# Patient Record
Sex: Female | Born: 1980 | Race: Black or African American | Hispanic: No | Marital: Single | Smoking: Current every day smoker
Health system: Southern US, Community
[De-identification: ages and names within clinical notes are randomized; demographics above are authoritative.]

## PROBLEM LIST (undated history)

## (undated) DIAGNOSIS — R7303 Prediabetes: Secondary | ICD-10-CM

## (undated) DIAGNOSIS — E119 Type 2 diabetes mellitus without complications: Secondary | ICD-10-CM

## (undated) DIAGNOSIS — D509 Iron deficiency anemia, unspecified: Secondary | ICD-10-CM

## (undated) DIAGNOSIS — I10 Essential (primary) hypertension: Secondary | ICD-10-CM

## (undated) HISTORY — DX: Iron deficiency anemia, unspecified: D50.9

## (undated) HISTORY — DX: Prediabetes: R73.03

---

## 2004-06-21 ENCOUNTER — Emergency Department: Payer: Self-pay | Admitting: Emergency Medicine

## 2009-01-20 ENCOUNTER — Emergency Department: Payer: Self-pay | Admitting: Emergency Medicine

## 2009-01-22 ENCOUNTER — Encounter: Payer: Self-pay | Admitting: Maternal and Fetal Medicine

## 2009-02-07 ENCOUNTER — Emergency Department: Payer: Self-pay | Admitting: Emergency Medicine

## 2010-08-08 ENCOUNTER — Emergency Department: Payer: Self-pay | Admitting: Emergency Medicine

## 2010-08-08 DIAGNOSIS — F17200 Nicotine dependence, unspecified, uncomplicated: Secondary | ICD-10-CM | POA: Insufficient documentation

## 2010-08-08 DIAGNOSIS — R519 Headache, unspecified: Secondary | ICD-10-CM | POA: Insufficient documentation

## 2010-08-08 DIAGNOSIS — R112 Nausea with vomiting, unspecified: Secondary | ICD-10-CM | POA: Insufficient documentation

## 2010-08-08 DIAGNOSIS — R51 Headache: Secondary | ICD-10-CM | POA: Insufficient documentation

## 2010-08-23 ENCOUNTER — Emergency Department: Payer: Self-pay | Admitting: Unknown Physician Specialty

## 2010-09-17 ENCOUNTER — Emergency Department: Payer: Self-pay | Admitting: Emergency Medicine

## 2010-09-19 DIAGNOSIS — G35 Multiple sclerosis: Secondary | ICD-10-CM | POA: Insufficient documentation

## 2013-03-18 IMAGING — CT CT HEAD WITHOUT CONTRAST
2 series · 15 of 30 positions shown, 19 images · non-contrast
Comparison: none

REASON FOR EXAM: vertigo w/ vertical nyustagmus
COMMENTS:

PROCEDURE:     CT  - CT HEAD WITHOUT CONTRAST  - August 08, 2010  [DATE]
RESULT:     Comparison:  None
TECHNIQUE: Multiple axial images from the foramen magnum to the vertex were
obtained without IV contrast.

[Series 2: without · axial · non-contrast · 0.42mm/px · z∈[-96,+29]mm · 13 of 31 slices shown, 17 images]
[im 3/31  brain]
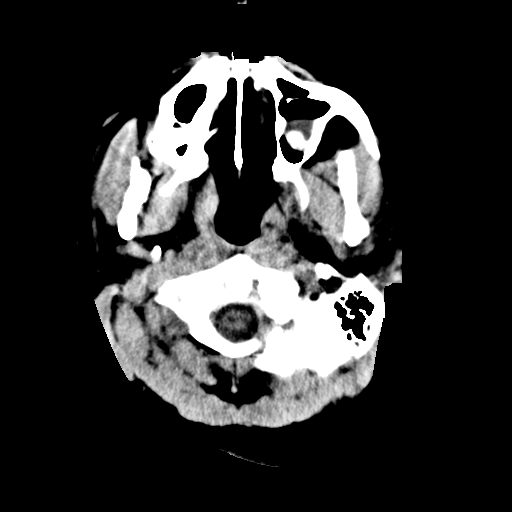
[im 3/31  bone]
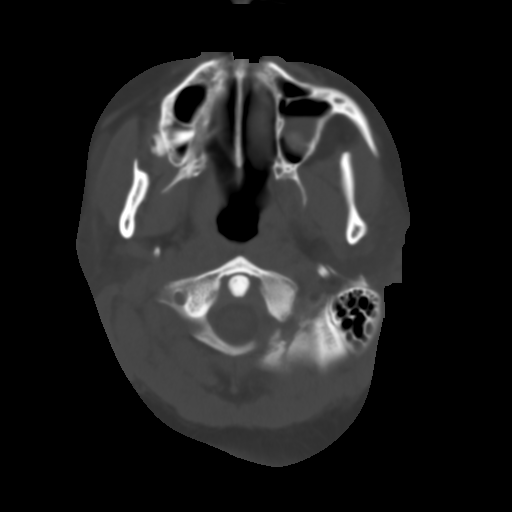
[im 5/31  brain]
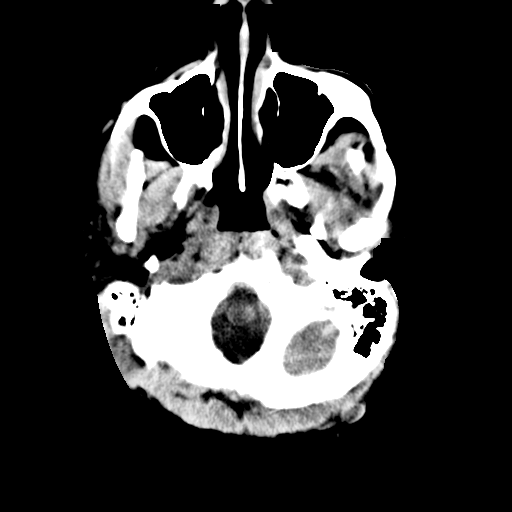
[im 7/31  brain]
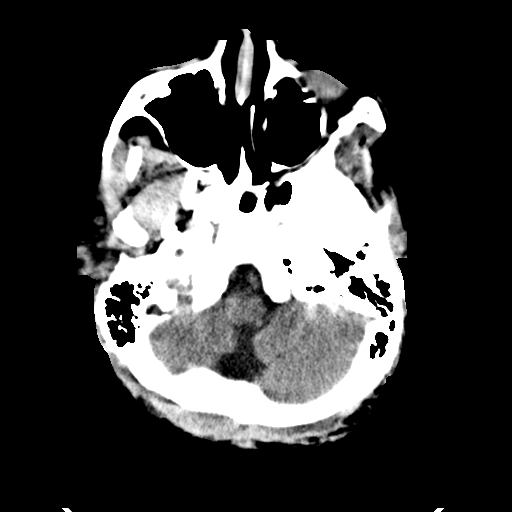
[im 9/31  brain]
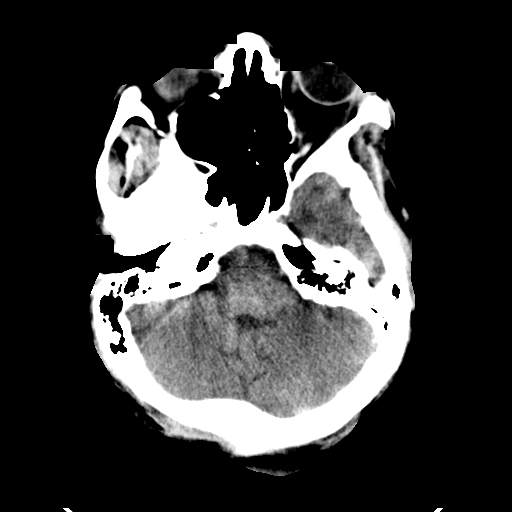
[im 11/31  brain]
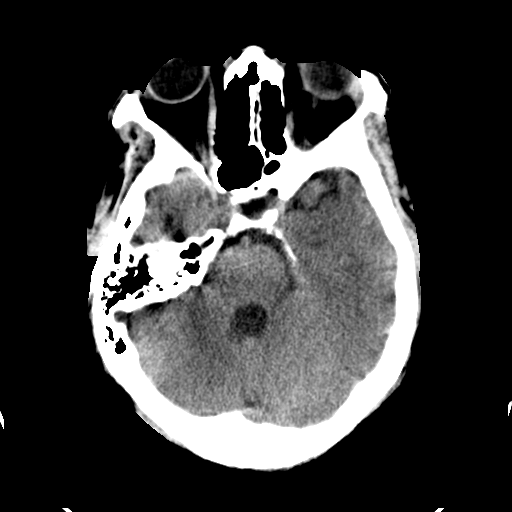
[im 11/31  bone]
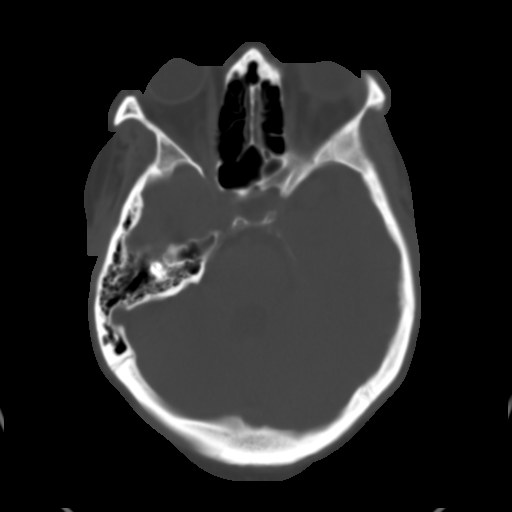
[im 13/31  brain]
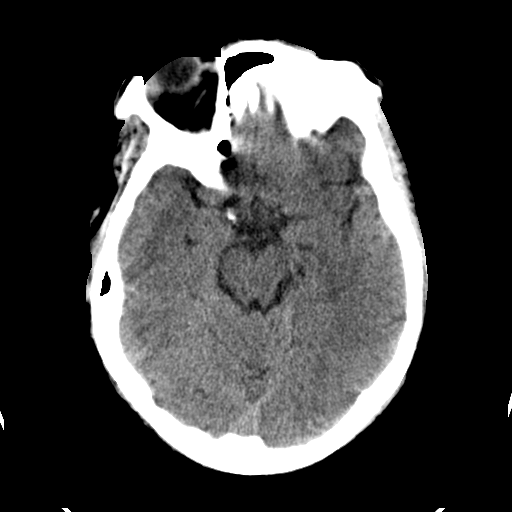
[im 16/31  brain]
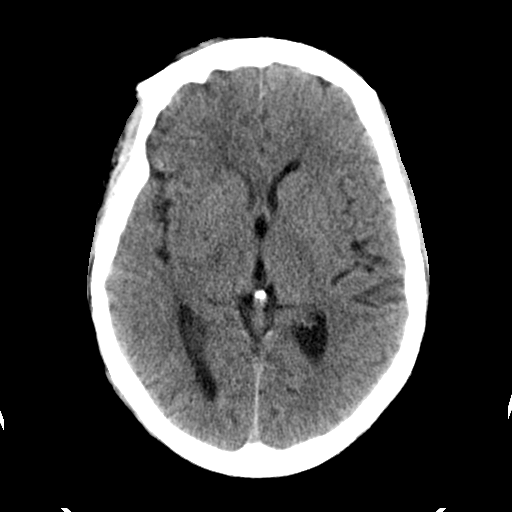
[im 18/31  brain]
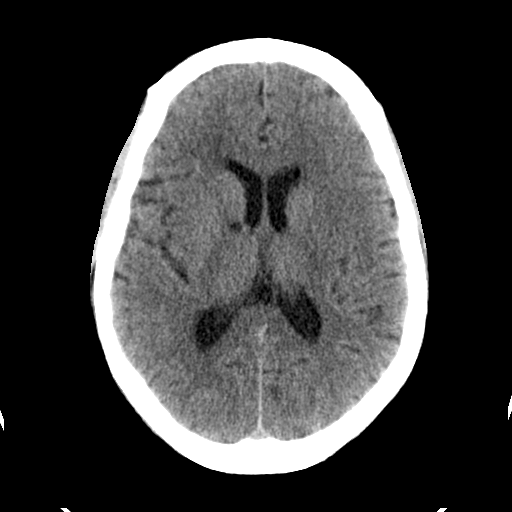
[im 20/31  brain]
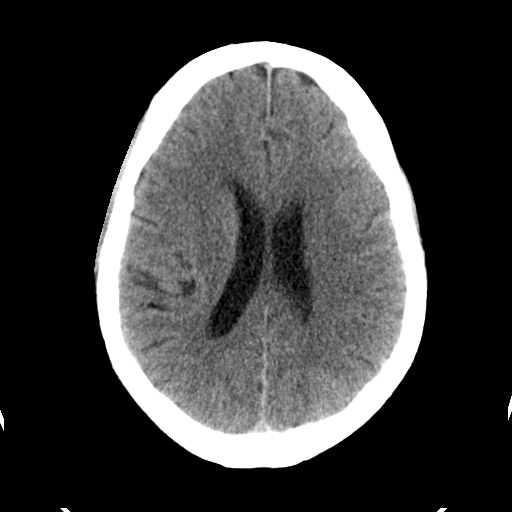
[im 20/31  bone]
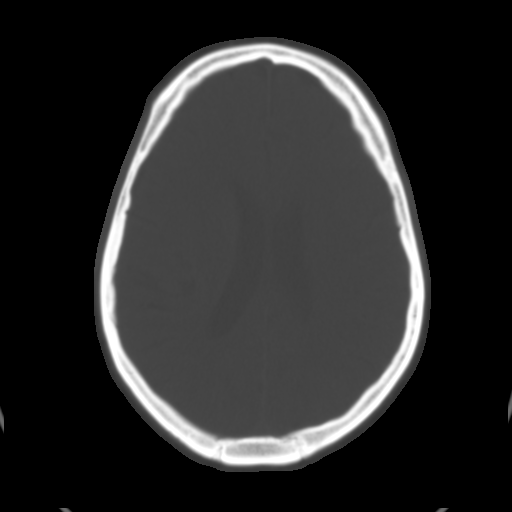
[im 22/31  brain]
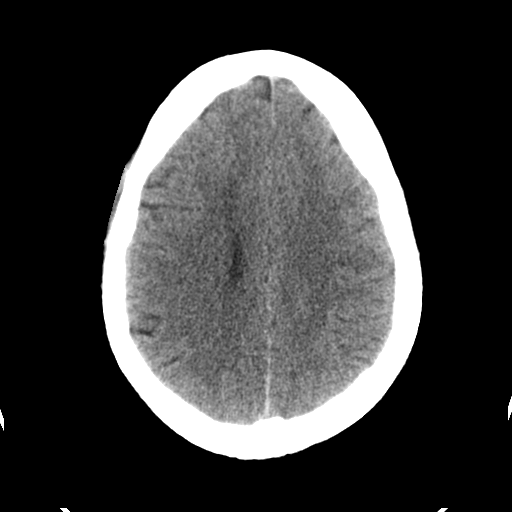
[im 24/31  brain]
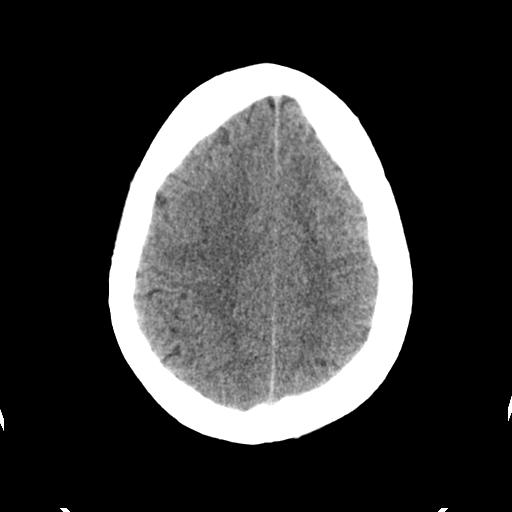
[im 26/31  brain]
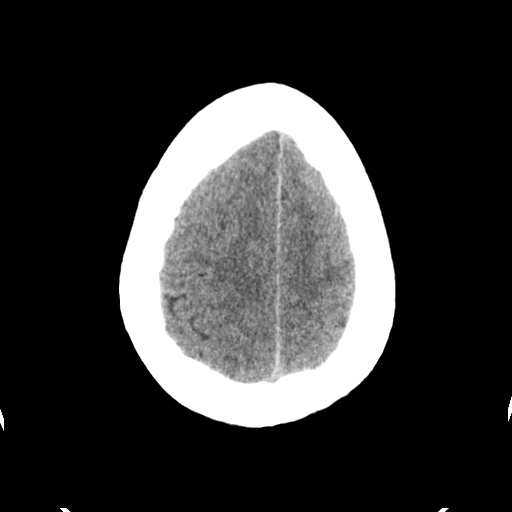
[im 28/31  brain]
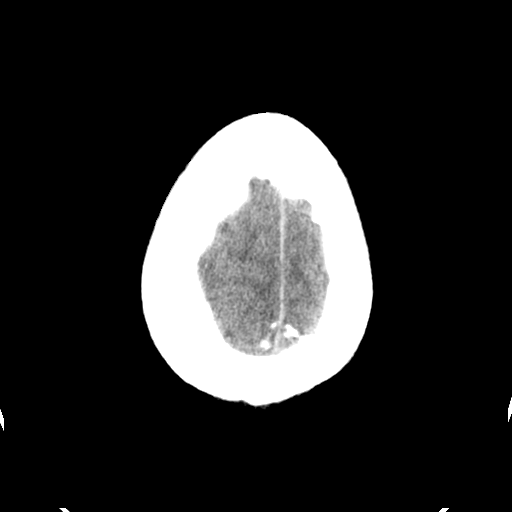
[im 28/31  bone]
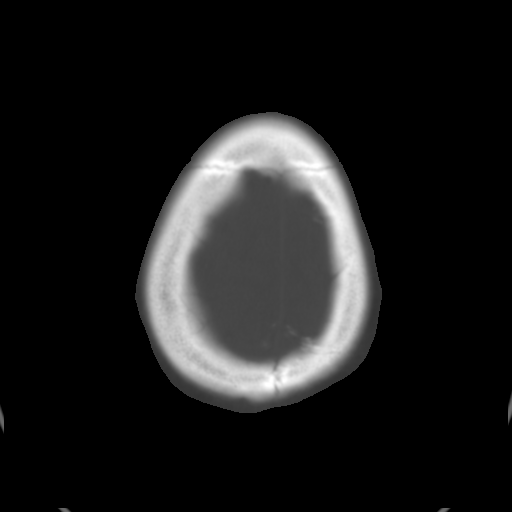

[Series 3: bone · axial · 0.42mm/px · z∈[-96,-76]mm · 2 of 31 slices shown]
[im 3/31  bone]
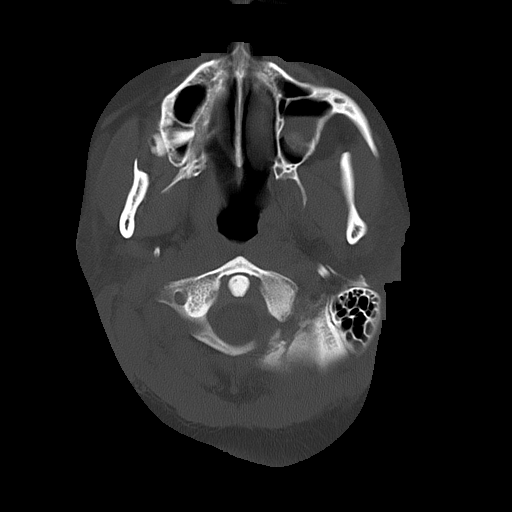
[im 7/31  bone]
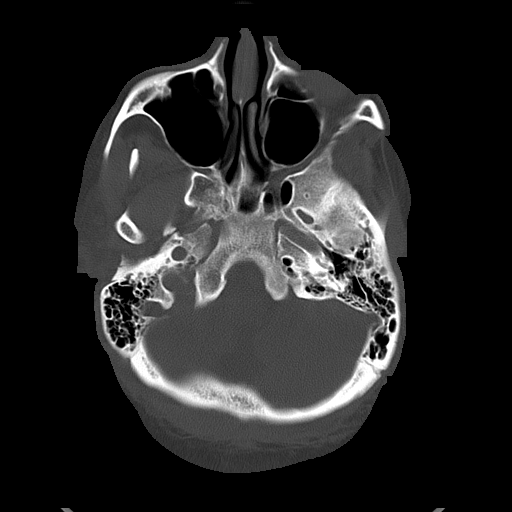

[15 of 30 positions shown; findings below may reference images not displayed]

FINDINGS: There is a small, 4 mm focus of low-attenuation near the posterior aspect of
the right caudate head, near the medial aspect of the right anterior limb of
the internal capsule. This is indeterminate, but may represent an
age-indeterminate lacunar type infarct. No acute intracranial hemorrhage. No
midline shift. The ventricles are normal in size. No extra-axial fluid
collection. No evidence of cortical-based infarction.

Small mucus retention cyst is seen in the left maxillary sinus.
IMPRESSION: 1. No acute intracranial hemorrhage.
2. Findings which may represent a small age indeterminate lacunar infarct in
the region of the right caudate head and the right anterior limb of the
internal capsule. Further evaluation with contrast-enhanced MRI is
recommended.

## 2017-02-06 ENCOUNTER — Other Ambulatory Visit: Payer: Self-pay

## 2017-02-06 ENCOUNTER — Encounter: Payer: Self-pay | Admitting: Emergency Medicine

## 2017-02-06 ENCOUNTER — Emergency Department: Payer: No Typology Code available for payment source

## 2017-02-06 DIAGNOSIS — I10 Essential (primary) hypertension: Secondary | ICD-10-CM | POA: Diagnosis not present

## 2017-02-06 DIAGNOSIS — M7918 Myalgia, other site: Secondary | ICD-10-CM | POA: Insufficient documentation

## 2017-02-06 DIAGNOSIS — F17200 Nicotine dependence, unspecified, uncomplicated: Secondary | ICD-10-CM | POA: Insufficient documentation

## 2017-02-06 DIAGNOSIS — M25512 Pain in left shoulder: Secondary | ICD-10-CM | POA: Diagnosis not present
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
## 2017-02-07 ENCOUNTER — Emergency Department
Admission: EM | Admit: 2017-02-07 | Discharge: 2017-02-07 | Disposition: A | Discharge status: Home / Self Care | Payer: No Typology Code available for payment source | Attending: Emergency Medicine | Admitting: Emergency Medicine

## 2017-02-07 DIAGNOSIS — M7918 Myalgia, other site: Secondary | ICD-10-CM

## 2017-02-07 DIAGNOSIS — I10 Essential (primary) hypertension: Secondary | ICD-10-CM

## 2017-02-07 DIAGNOSIS — M25512 Pain in left shoulder: Secondary | ICD-10-CM

## 2017-02-07 HISTORY — DX: Essential (primary) hypertension: I10

## 2017-02-07 MED ORDER — LIDOCAINE 5 % EX PTCH
1.0000 | MEDICATED_PATCH | CUTANEOUS | Status: DC
  Administered 2017-02-07: 1 via TRANSDERMAL
  Filled 2017-02-07: qty 1

## 2017-02-07 MED ORDER — HYDROCHLOROTHIAZIDE 25 MG PO TABS: 25.0000 mg | ORAL_TABLET | Freq: Every day | ORAL | 0 refills | Status: DC

## 2017-02-07 MED ORDER — KETOROLAC TROMETHAMINE 60 MG/2ML IM SOLN
60.0000 mg | Freq: Once | INTRAMUSCULAR | Status: DC
  Filled 2017-02-07: qty 2

## 2017-02-07 MED ORDER — TRAMADOL HCL 50 MG PO TABS: 50.0000 mg | ORAL_TABLET | Freq: Four times a day (QID) | ORAL | 0 refills | Status: DC | PRN

## 2017-02-07 NOTE — Discharge Instructions (Signed)
Please continue taking pain medications and anti inflammatory medications. Please follow up with [facility] for further evaluation of your blood pressure
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
## 2017-02-07 NOTE — ED Notes (Signed)
Reviewed d/c instructions, follow-up care, prescription with patient. Patient verbalized understanding 

## 2017-03-16 DIAGNOSIS — K5900 Constipation, unspecified: Secondary | ICD-10-CM | POA: Diagnosis not present

## 2017-03-16 DIAGNOSIS — I10 Essential (primary) hypertension: Secondary | ICD-10-CM | POA: Diagnosis not present

## 2017-05-04 DIAGNOSIS — D649 Anemia, unspecified: Secondary | ICD-10-CM | POA: Diagnosis not present

## 2017-05-04 DIAGNOSIS — R7309 Other abnormal glucose: Secondary | ICD-10-CM | POA: Diagnosis not present

## 2017-05-04 DIAGNOSIS — I10 Essential (primary) hypertension: Secondary | ICD-10-CM | POA: Diagnosis not present

## 2017-05-11 ENCOUNTER — Ambulatory Visit (INDEPENDENT_AMBULATORY_CARE_PROVIDER_SITE_OTHER): Payer: 59 | Admitting: Family Medicine

## 2017-05-11 ENCOUNTER — Other Ambulatory Visit: Payer: Self-pay

## 2017-05-11 ENCOUNTER — Encounter: Payer: Self-pay | Admitting: Family Medicine

## 2017-05-11 ENCOUNTER — Ambulatory Visit: Payer: Self-pay | Admitting: Family Medicine

## 2017-05-11 VITALS — BP 140/94 | HR 98 | Temp 97.9°F | Ht 64.57 in | Wt 152.8 lb

## 2017-05-11 DIAGNOSIS — T7491XA Unspecified adult maltreatment, confirmed, initial encounter: Secondary | ICD-10-CM | POA: Diagnosis not present

## 2017-05-11 DIAGNOSIS — D500 Iron deficiency anemia secondary to blood loss (chronic): Secondary | ICD-10-CM | POA: Insufficient documentation

## 2017-05-11 DIAGNOSIS — M79675 Pain in left toe(s): Secondary | ICD-10-CM | POA: Diagnosis not present

## 2017-05-11 DIAGNOSIS — B351 Tinea unguium: Secondary | ICD-10-CM | POA: Diagnosis not present

## 2017-05-11 DIAGNOSIS — R7303 Prediabetes: Secondary | ICD-10-CM | POA: Diagnosis not present

## 2017-05-11 DIAGNOSIS — Z113 Encounter for screening for infections with a predominantly sexual mode of transmission: Secondary | ICD-10-CM | POA: Diagnosis not present

## 2017-05-11 DIAGNOSIS — H53422 Scotoma of blind spot area, left eye: Secondary | ICD-10-CM | POA: Diagnosis not present

## 2017-05-11 DIAGNOSIS — Z124 Encounter for screening for malignant neoplasm of cervix: Secondary | ICD-10-CM

## 2017-05-11 DIAGNOSIS — I10 Essential (primary) hypertension: Secondary | ICD-10-CM | POA: Diagnosis not present

## 2017-05-11 DIAGNOSIS — F322 Major depressive disorder, single episode, severe without psychotic features: Secondary | ICD-10-CM | POA: Diagnosis not present

## 2017-05-11 DIAGNOSIS — N920 Excessive and frequent menstruation with regular cycle: Secondary | ICD-10-CM | POA: Insufficient documentation

## 2017-05-11 DIAGNOSIS — M79674 Pain in right toe(s): Secondary | ICD-10-CM | POA: Diagnosis not present

## 2017-05-11 MED ORDER — SERTRALINE HCL 25 MG PO TABS: 25.0000 mg | ORAL_TABLET | Freq: Every day | ORAL | 2 refills | Status: DC

## 2017-05-11 MED ORDER — HYDROCHLOROTHIAZIDE 25 MG PO TABS: 25.0000 mg | ORAL_TABLET | Freq: Every day | ORAL | 5 refills | Status: DC

## 2017-05-11 MED ORDER — LISINOPRIL 10 MG PO TABS: 10.0000 mg | ORAL_TABLET | Freq: Every day | ORAL | 5 refills | Status: DC
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
## 2017-05-11 NOTE — Patient Instructions (Signed)
     IF you received an x-ray today, you will receive an invoice from [facility]. Please contact [facility] at [phone] with questions or concerns regarding your invoice.   IF you received labwork today, you will receive an invoice from [facility]. Please contact [facility] at [phone] with questions or concerns regarding your invoice.   Our billing staff will not be able to assist you with questions regarding bills from these companies.  You will be contacted with the lab results as soon as they are available. The fastest way to get your results is to activate your My Chart account. Instructions are located on the last page of this paperwork. If you have not heard from us regarding the results in 2 weeks, please contact this office.     

## 2017-05-12 LAB — BASIC METABOLIC PANEL
BUN/Creatinine Ratio: 12 (ref 9–23)
BUN: 9 mg/dL (ref 6–20)
CO2: 21 mmol/L (ref 20–29)
Calcium: 9.8 mg/dL (ref 8.7–10.2)
Chloride: 98 mmol/L (ref 96–106)
Creatinine, Ser: 0.75 mg/dL (ref 0.57–1.00)
GFR calc Af Amer: 120 mL/min/{1.73_m2} (ref >59)
GFR calc non Af Amer: 104 mL/min/{1.73_m2} (ref >59)
Glucose: 145 mg/dL — ABNORMAL HIGH (ref 65–99)
Potassium: 4.3 mmol/L (ref 3.5–5.2)
Sodium: 137 mmol/L (ref 134–144)

## 2017-05-12 LAB — CBC
Hematocrit: 31.7 % — ABNORMAL LOW (ref 34.0–46.6)
Hemoglobin: 9.2 g/dL — ABNORMAL LOW (ref 11.1–15.9)
MCH: 20.9 pg — ABNORMAL LOW (ref 26.6–33.0)
MCHC: 29 g/dL — ABNORMAL LOW (ref 31.5–35.7)
MCV: 72 fL — ABNORMAL LOW (ref 79–97)
Platelets: 495 10*3/uL — ABNORMAL HIGH (ref 150–379)
RBC: 4.41 x10E6/uL (ref 3.77–5.28)
RDW: 21.8 % — ABNORMAL HIGH (ref 12.3–15.4)
WBC: 9.1 10*3/uL (ref 3.4–10.8)

## 2017-05-12 LAB — FERRITIN: Ferritin: 26 ng/mL (ref 15–150)

## 2017-05-12 LAB — HIV ANTIBODY (ROUTINE TESTING W REFLEX): HIV Screen 4th Generation wRfx: NONREACTIVE

## 2017-05-12 LAB — RPR: RPR Ser Ql: NONREACTIVE

## 2017-05-13 LAB — GC/CHLAMYDIA PROBE AMP
Chlamydia trachomatis, NAA: NEGATIVE
Neisseria gonorrhoeae by PCR: NEGATIVE

## 2017-05-16 LAB — PAP IG W/ RFLX HPV ASCU: PAP Smear Comment: 0

## 2017-05-16 MED ORDER — FERROUS GLUCONATE 324 (38 FE) MG PO TABS: 324.0000 mg | ORAL_TABLET | Freq: Every day | ORAL | 3 refills | Status: DC

## 2017-05-21 DIAGNOSIS — R7309 Other abnormal glucose: Secondary | ICD-10-CM | POA: Insufficient documentation

## 2017-05-21 DIAGNOSIS — D649 Anemia, unspecified: Secondary | ICD-10-CM | POA: Insufficient documentation

## 2017-05-22 ENCOUNTER — Telehealth: Payer: Self-pay | Admitting: Family Medicine

## 2017-05-22 NOTE — Telephone Encounter (Signed)
Pt has referral in for ophthalmology. Can we get OV notes from 2/14 signed so we can send referral? Thank you!

## 2017-05-22 NOTE — Telephone Encounter (Signed)
So sorry about delay, note completed. thanks

## 2017-05-23 NOTE — Telephone Encounter (Signed)
Referral sent to [facility] on 2/26. Thanks!

## 2017-05-23 NOTE — Telephone Encounter (Signed)
noted 

## 2017-05-24 DIAGNOSIS — H3343 Traction detachment of retina, bilateral: Secondary | ICD-10-CM | POA: Diagnosis not present

## 2017-05-24 DIAGNOSIS — H3500 Unspecified background retinopathy: Secondary | ICD-10-CM | POA: Diagnosis not present

## 2017-05-24 DIAGNOSIS — H3563 Retinal hemorrhage, bilateral: Secondary | ICD-10-CM | POA: Diagnosis not present

## 2017-05-24 LAB — HM DIABETES EYE EXAM

## 2017-05-26 ENCOUNTER — Ambulatory Visit (INDEPENDENT_AMBULATORY_CARE_PROVIDER_SITE_OTHER): Payer: 59 | Admitting: Family Medicine

## 2017-05-26 ENCOUNTER — Encounter: Payer: Self-pay | Admitting: Family Medicine

## 2017-05-26 VITALS — BP 132/84 | HR 84 | Temp 97.9°F | Resp 16 | Ht 64.0 in | Wt 156.0 lb

## 2017-05-26 DIAGNOSIS — F322 Major depressive disorder, single episode, severe without psychotic features: Secondary | ICD-10-CM

## 2017-05-26 DIAGNOSIS — D500 Iron deficiency anemia secondary to blood loss (chronic): Secondary | ICD-10-CM | POA: Diagnosis not present

## 2017-05-26 DIAGNOSIS — T7491XD Unspecified adult maltreatment, confirmed, subsequent encounter: Secondary | ICD-10-CM

## 2017-05-26 DIAGNOSIS — I10 Essential (primary) hypertension: Secondary | ICD-10-CM

## 2017-05-26 MED ORDER — SERTRALINE HCL 50 MG PO TABS: 50.0000 mg | ORAL_TABLET | Freq: Every day | ORAL | 2 refills | Status: DC

## 2017-05-26 NOTE — Patient Instructions (Addendum)
1. Increasing sertraline to 50mg  once a day. If after 2 weeks you are tolerating this dose well but depression not significantly improved, increase to 100mg  (2 tabs) once a day. Please let me know if you do this so that I can send in a new prescription.     IF you received an x-ray today, you will receive an invoice from [facility]. Please contact [facility] at [phone] with questions or concerns regarding your invoice.   IF you received labwork today, you will receive an invoice from [facility]. Please contact [facility] at [phone] with questions or concerns regarding your invoice.   Our billing staff will not be able to assist you with questions regarding bills from these companies.  You will be contacted with the lab results as soon as they are available. The fastest way to get your results is to activate your My Chart account. Instructions are located on the last page of this paperwork. If you have not heard from us regarding the results in 2 weeks, please contact this office.
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
## 2017-07-07 ENCOUNTER — Ambulatory Visit: Payer: 59 | Admitting: Family Medicine

## 2019-05-17 ENCOUNTER — Inpatient Hospital Stay
Admission: EM | Admit: 2019-05-17 | Discharge: 2019-05-19 | DRG: 065 | Disposition: A | Discharge status: Home / Self Care | Payer: Medicaid Other | Attending: Internal Medicine | Admitting: Internal Medicine

## 2019-05-17 ENCOUNTER — Encounter: Payer: Self-pay | Admitting: Intensive Care

## 2019-05-17 ENCOUNTER — Other Ambulatory Visit: Payer: Self-pay

## 2019-05-17 ENCOUNTER — Emergency Department: Payer: Medicaid Other

## 2019-05-17 DIAGNOSIS — D500 Iron deficiency anemia secondary to blood loss (chronic): Secondary | ICD-10-CM | POA: Diagnosis present

## 2019-05-17 DIAGNOSIS — E785 Hyperlipidemia, unspecified: Secondary | ICD-10-CM | POA: Diagnosis present

## 2019-05-17 DIAGNOSIS — I639 Cerebral infarction, unspecified: Principal | ICD-10-CM | POA: Diagnosis present

## 2019-05-17 DIAGNOSIS — Z823 Family history of stroke: Secondary | ICD-10-CM

## 2019-05-17 DIAGNOSIS — I10 Essential (primary) hypertension: Secondary | ICD-10-CM | POA: Diagnosis present

## 2019-05-17 DIAGNOSIS — Z9119 Patient's noncompliance with other medical treatment and regimen: Secondary | ICD-10-CM

## 2019-05-17 DIAGNOSIS — Z20822 Contact with and (suspected) exposure to covid-19: Secondary | ICD-10-CM | POA: Diagnosis present

## 2019-05-17 DIAGNOSIS — E1165 Type 2 diabetes mellitus with hyperglycemia: Secondary | ICD-10-CM | POA: Diagnosis present

## 2019-05-17 DIAGNOSIS — Z8249 Family history of ischemic heart disease and other diseases of the circulatory system: Secondary | ICD-10-CM

## 2019-05-17 DIAGNOSIS — F329 Major depressive disorder, single episode, unspecified: Secondary | ICD-10-CM | POA: Diagnosis present

## 2019-05-17 DIAGNOSIS — F32A Depression, unspecified: Secondary | ICD-10-CM

## 2019-05-17 DIAGNOSIS — R4781 Slurred speech: Secondary | ICD-10-CM | POA: Diagnosis present

## 2019-05-17 DIAGNOSIS — R7303 Prediabetes: Secondary | ICD-10-CM | POA: Diagnosis present

## 2019-05-17 DIAGNOSIS — R531 Weakness: Secondary | ICD-10-CM

## 2019-05-17 DIAGNOSIS — R296 Repeated falls: Secondary | ICD-10-CM

## 2019-05-17 DIAGNOSIS — G8194 Hemiplegia, unspecified affecting left nondominant side: Secondary | ICD-10-CM | POA: Diagnosis present

## 2019-05-17 DIAGNOSIS — F17210 Nicotine dependence, cigarettes, uncomplicated: Secondary | ICD-10-CM | POA: Diagnosis present

## 2019-05-17 HISTORY — DX: Type 2 diabetes mellitus without complications: E11.9

## 2019-05-17 LAB — BASIC METABOLIC PANEL
Anion gap: 13 (ref 5–15)
BUN: 9 mg/dL (ref 6–20)
CO2: 20 mmol/L — ABNORMAL LOW (ref 22–32)
Calcium: 9 mg/dL (ref 8.9–10.3)
Chloride: 99 mmol/L (ref 98–111)
Creatinine, Ser: 0.56 mg/dL (ref 0.44–1.00)
GFR calc Af Amer: >60 mL/min (ref >60)
GFR calc non Af Amer: >60 mL/min (ref >60)
Glucose, Bld: 346 mg/dL — ABNORMAL HIGH (ref 70–99)
Potassium: 4 mmol/L (ref 3.5–5.1)
Sodium: 132 mmol/L — ABNORMAL LOW (ref 135–145)

## 2019-05-17 LAB — CBC
HCT: 31.6 % — ABNORMAL LOW (ref 36.0–46.0)
Hemoglobin: 9.1 g/dL — ABNORMAL LOW (ref 12.0–15.0)
MCH: 20.1 pg — ABNORMAL LOW (ref 26.0–34.0)
MCHC: 28.8 g/dL — ABNORMAL LOW (ref 30.0–36.0)
MCV: 69.8 fL — ABNORMAL LOW (ref 80.0–100.0)
Platelets: 318 10*3/uL (ref 150–400)
RBC: 4.53 MIL/uL (ref 3.87–5.11)
RDW: 16.2 % — ABNORMAL HIGH (ref 11.5–15.5)
WBC: 5.6 10*3/uL (ref 4.0–10.5)
nRBC: 0 % (ref 0.0–0.2)

## 2019-05-17 LAB — BLOOD GAS, VENOUS
Acid-base deficit: 0.8 mmol/L (ref 0.0–2.0)
Bicarbonate: 23.5 mmol/L (ref 20.0–28.0)
O2 Saturation: 94.8 %
Patient temperature: 37
pCO2, Ven: 37 mmHg — ABNORMAL LOW (ref 44.0–60.0)
pH, Ven: 7.41 (ref 7.250–7.430)
pO2, Ven: 74 mmHg — ABNORMAL HIGH (ref 32.0–45.0)

## 2019-05-17 LAB — GLUCOSE, CAPILLARY: Glucose-Capillary: 325 mg/dL — ABNORMAL HIGH (ref 70–99)

## 2019-05-17 MED ORDER — LABETALOL HCL 5 MG/ML IV SOLN
10.0000 mg | Freq: Once | INTRAVENOUS | Status: AC
  Administered 2019-05-17: 10 mg via INTRAVENOUS
  Filled 2019-05-17: qty 4

## 2019-05-17 MED ORDER — SODIUM CHLORIDE 0.9 % IV SOLN: Freq: Once | INTRAVENOUS | Status: AC

## 2019-05-17 MED ORDER — ASPIRIN 81 MG PO CHEW
324.0000 mg | CHEWABLE_TABLET | Freq: Once | ORAL | Status: AC
  Administered 2019-05-17: 324 mg via ORAL
  Filled 2019-05-17: qty 4

## 2019-05-17 MED ORDER — SODIUM CHLORIDE 0.9 % IV BOLUS
1000.0000 mL | Freq: Once | INTRAVENOUS | Status: AC
  Administered 2019-05-17: 19:00:00 1000 mL via INTRAVENOUS

## 2019-05-17 NOTE — H&P (Signed)
History and Physical        Hospital Admission Note Date: 05/18/2019  Patient name: Colleen Farmer Medical record number: [id number] Date of birth: [birth year] Age: 39 y.o. Gender: female  PCP: Patient, No Pcp Per    Patient coming from: Home  I have reviewed all records in the [facility].    Chief Complaint:  Recurrent Falls   HPI: EARLY ORD is a 39 y.o. female with PMH of HTN, prediabetes, and iron deficiency anemia who presents for multiple falls that have occurred over past week. Has been feeling weaker on her left side. Has not taken blood pressure medications for past two years.    ED work-up/course:   Review of Systems: Positives marked in 'bold' Constitutional: Denies fever, chills, diaphoresis, poor appetite and fatigue.  HEENT: Denies photophobia, eye pain, redness, hearing loss, ear pain, congestion, sore throat, rhinorrhea, sneezing, mouth sores, trouble swallowing, neck pain, neck stiffness and tinnitus.   Respiratory: Denies SOB, DOE, cough, chest tightness,  and wheezing.   Cardiovascular: Denies chest pain, palpitations and leg swelling.  Gastrointestinal: Denies nausea, vomiting, abdominal pain, diarrhea, constipation, blood in stool and abdominal distention.  Genitourinary: Denies dysuria, urgency, frequency, hematuria, flank pain and difficulty urinating.  Musculoskeletal: Denies myalgias, back pain, joint swelling, arthralgias and gait problem.  Skin: Denies pallor, rash and wound.  Neurological: Denies dizziness, seizures, syncope, weakness, light-headedness, numbness and headaches.  Hematological: Denies adenopathy. Easy bruising, personal or family bleeding history  Psychiatric/Behavioral: Denies suicidal ideation, mood changes, confusion, nervousness, sleep disturbance and agitation  Past Medical History: Past Medical History:  Diagnosis Date  . Diabetes mellitus without  complication (HCC)   . Hypertension   . Iron deficiency anemia   . Pre-diabetes     History reviewed. No pertinent surgical history.  Medications: Prior to Admission medications   Medication Sig Start Date End Date Taking? Authorizing Provider  ferrous gluconate (FERGON) 324 MG tablet Take 1 tablet (324 mg total) by mouth daily with breakfast. Patient not taking: Reported on 05/17/2019 05/16/17   Myles Lipps, MD  hydrochlorothiazide (HYDRODIURIL) 25 MG tablet Take 1 tablet (25 mg total) by mouth daily. Patient not taking: Reported on 05/17/2019 05/11/17   Myles Lipps, MD  lisinopril (PRINIVIL,ZESTRIL) 10 MG tablet Take 1 tablet (10 mg total) by mouth daily. Patient not taking: Reported on 05/17/2019 05/11/17   Myles Lipps, MD  sertraline (ZOLOFT) 50 MG tablet Take 1 tablet (50 mg total) by mouth daily. Patient not taking: Reported on 05/17/2019 05/26/17   Myles Lipps, MD    Allergies:  No Known Allergies  Social History:  reports that she has been smoking cigarettes. She has never used smokeless tobacco. She reports current alcohol use. She reports that she does not use drugs.  Family History: Family History  Problem Relation Age of Onset  . Stroke Mother   . Healthy Father   . Hypertension Sister     Physical Exam: Blood pressure (!) 157/99, pulse 79, temperature 97.9 F (36.6 C), temperature source Oral, resp. rate 17, height 5\' 3"  (1.6 m), weight 79.8 kg, SpO2 98 %. General: Alert, awake, oriented x3, in no  acute distress. Eyes: pink conjunctiva,anicteric sclera, pupils equal and reactive to light and accomodation, HEENT: normocephalic, atraumatic, oropharynx clear Neck: supple, no masses or lymphadenopathy, no goiter, no bruits, no JVD CVS: Regular rate and rhythm, without murmurs, rubs or gallops. No lower extremity edema Resp : Clear to auscultation bilaterally, no wheezing, rales or rhonchi. GI : Soft, nontender, nondistended, positive bowel sounds, no  masses. No hepatomegaly. No hernia.  Musculoskeletal: No clubbing or cyanosis, positive pedal pulses. No contracture. ROM intact  Neuro: Left UE 4/5 strength compared to right UE 5/5. Lower extremity strength appears intact. Slowed finger to nose response on left. CN II-XII grossly intact.  Psych: alert and oriented x 3, normal mood and affect Skin: no rashes or lesions, warm and dry   LABS on Admission: I have personally reviewed all the labs and imagings below    Basic Metabolic Panel: Recent Labs  Lab 05/17/19 1848  NA 132*  K 4.0  CL 99  CO2 20*  GLUCOSE 346*  BUN 9  CREATININE 0.56  CALCIUM 9.0   Liver Function Tests: No results for input(s): AST, ALT, ALKPHOS, BILITOT, PROT, ALBUMIN in the last 168 hours. No results for input(s): LIPASE, AMYLASE in the last 168 hours. No results for input(s): AMMONIA in the last 168 hours. CBC: Recent Labs  Lab 05/17/19 1848  WBC 5.6  HGB 9.1*  HCT 31.6*  MCV 69.8*  PLT 318   Cardiac Enzymes: No results for input(s): CKTOTAL, CKMB, CKMBINDEX, TROPONINI in the last 168 hours. BNP: Invalid input(s): POCBNP CBG: Recent Labs  Lab 05/17/19 1840  GLUCAP 325*    Radiological Exams on Admission:  CT Head Wo Contrast  Result Date: 05/17/2019 CLINICAL DATA:  Ataxia, stroke suspected. Patient reports multiple falls in the past week. EXAM: CT HEAD WITHOUT CONTRAST TECHNIQUE: Contiguous axial images were obtained from the base of the skull through the vertex without intravenous contrast. COMPARISON:  Head CT 09/17/2010 FINDINGS: Brain: No acute intracranial hemorrhage. Mild generalized cerebral and cerebellar atrophy that is advanced for age. Encephalomalacia in the right caudate and basal ganglia. Additional periventricular and deep white matter hypodensity most consistent with chronic small vessel ischemia. Small central pontine hypodensity appears chronic. No midline shift or mass effect. Vascular: Atherosclerosis of the left carotid  siphon, advanced for age. No hyperdense vessel. Skull: No fracture or focal lesion. Sinuses/Orbits: Paranasal sinuses and mastoid air cells are clear. The visualized orbits are unremarkable. Other: Dermal lesion with calcification at the scalp vertex, series 4, image 27 this should be amenable to direct visualization. IMPRESSION: 1. No acute intracranial abnormality. 2. Generalized cerebral and cerebellar atrophy that is advanced for age. Periventricular and deep white matter hypodensity is nonspecific, typically chronic small vessel ischemia but unusual for age. Demyelinating disorders such as multiple sclerosis could have a similar, consider further evaluation with brain MRI. Remote lacunar infarcts in the right caudate and basal ganglia. 3. Atherosclerosis of the left carotid siphon, advanced for age. 4. Dermal lesion at the scalp vertex, this should be amenable to direct visualization. Electronically Signed   By: Keith Rake M.D.   On: 05/17/2019 22:38      EKG: Independently reviewed. NSR   Assessment/Plan Active Problems:   Essential hypertension   Prediabetes   Iron deficiency anemia due to chronic blood loss   Left-sided weakness   Recurrent falls   Depression  Left Sided Weakness with Recurrent Falls  CT head with cerebral and cerebellar atrophy and hypodensities. Remote lacunar infarcts also present.  Concern for CVA especially given uncontrolled HTN and glucose. A demyelinating process such as MS also remains on differential. Warrants admission for further investigation.  -MedSurg observation -obtain MRI brain  -echo ordered  -S/p ASA 324 mg, continue 81 mg daily  -A1c/lipid panel/TSH ordered  -may benefit from neurology consult  -PT and OT eval   HTN  BP significantly elevated at presentation. Has been without medications for >2 years.  -resume HCTZ, will avoid rapid lowering of BP pending MRI results in the event this is an acute CVA   Prediabetes  No prior A1c  available for review. Glucose 346 at admission. -give 5u Novolog -A1c pending  -monitor CBGs, start SSI as needed    Anemia  Chronic, microcytic. Known history of iron deficiency anemia related to heavy menstrual periods.  -monitor HgB  -would benefit from Fe supplementation at d/c   Depression  -discuss restarting Zoloft with patient    DVT prophylaxis: Lovenox   CODE STATUS: FULL   Consults called: None   Family Communication: Admission, patients condition and plan of care including tests being ordered have been discussed with the patient  who indicates understanding and agree with the plan and Code Status  Admission status: Observation   The medical decision making on this patient was of high complexity and the patient is at high risk for clinical deterioration, therefore this is a level 3 admission.  Severity of Illness:     Moderate  The appropriate patient status for this patient is OBSERVATION. Observation status is judged to be reasonable and necessary in order to provide the required intensity of service to ensure the patient's safety. The patient's presenting symptoms, physical exam findings, and initial radiographic and laboratory data in the context of their medical condition is felt to place them at decreased risk for further clinical deterioration. Furthermore, it is anticipated that the patient will be medically stable for discharge from the hospital within 2 midnights of admission. The following factors support the patient status of observation.   " The patient's presenting symptoms include left sided weakness, recurrent falls. " The physical exam findings include left sided weakness. " The initial radiographic and laboratory data are Ct head with atrophy/hypodensity/old infarct and elevated glucose.     Time Spent on Admission: 52 min      De Hollingshead D.O.  [facility] 05/18/2019, 12:24 AM
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
## 2019-05-17 NOTE — ED Notes (Signed)
Pt instructed on UA cup for specimen sample and call bell. Hat placed in toilet as well. Pt verbalizes understanding.

## 2019-05-17 NOTE — ED Triage Notes (Addendum)
Patient reports she has had multiple falls in the last week and she is unsure what is causing her falls. A&O x4 during triage. PAtient is diabetic but has not taken medicine in two years due to cost and no insurance
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
## 2019-05-18 ENCOUNTER — Observation Stay: Payer: Medicaid Other

## 2019-05-18 ENCOUNTER — Inpatient Hospital Stay: Payer: Medicaid Other

## 2019-05-18 DIAGNOSIS — G8194 Hemiplegia, unspecified affecting left nondominant side: Secondary | ICD-10-CM | POA: Diagnosis present

## 2019-05-18 DIAGNOSIS — Z9119 Patient's noncompliance with other medical treatment and regimen: Secondary | ICD-10-CM | POA: Diagnosis not present

## 2019-05-18 DIAGNOSIS — I639 Cerebral infarction, unspecified: Secondary | ICD-10-CM | POA: Diagnosis present

## 2019-05-18 DIAGNOSIS — Z8249 Family history of ischemic heart disease and other diseases of the circulatory system: Secondary | ICD-10-CM | POA: Diagnosis not present

## 2019-05-18 DIAGNOSIS — R531 Weakness: Secondary | ICD-10-CM | POA: Diagnosis not present

## 2019-05-18 DIAGNOSIS — F329 Major depressive disorder, single episode, unspecified: Secondary | ICD-10-CM

## 2019-05-18 DIAGNOSIS — Z20822 Contact with and (suspected) exposure to covid-19: Secondary | ICD-10-CM | POA: Diagnosis present

## 2019-05-18 DIAGNOSIS — F17210 Nicotine dependence, cigarettes, uncomplicated: Secondary | ICD-10-CM | POA: Diagnosis present

## 2019-05-18 DIAGNOSIS — R4781 Slurred speech: Secondary | ICD-10-CM | POA: Diagnosis present

## 2019-05-18 DIAGNOSIS — E785 Hyperlipidemia, unspecified: Secondary | ICD-10-CM | POA: Diagnosis present

## 2019-05-18 DIAGNOSIS — I10 Essential (primary) hypertension: Secondary | ICD-10-CM | POA: Diagnosis present

## 2019-05-18 DIAGNOSIS — Z823 Family history of stroke: Secondary | ICD-10-CM | POA: Diagnosis not present

## 2019-05-18 DIAGNOSIS — E119 Type 2 diabetes mellitus without complications: Secondary | ICD-10-CM

## 2019-05-18 DIAGNOSIS — R7303 Prediabetes: Secondary | ICD-10-CM

## 2019-05-18 DIAGNOSIS — R296 Repeated falls: Secondary | ICD-10-CM

## 2019-05-18 DIAGNOSIS — D500 Iron deficiency anemia secondary to blood loss (chronic): Secondary | ICD-10-CM | POA: Diagnosis present

## 2019-05-18 DIAGNOSIS — E1165 Type 2 diabetes mellitus with hyperglycemia: Secondary | ICD-10-CM | POA: Diagnosis present

## 2019-05-18 LAB — URINALYSIS, COMPLETE (UACMP) WITH MICROSCOPIC
Bilirubin Urine: NEGATIVE
Glucose, UA: >=500 mg/dL — AB
Hgb urine dipstick: NEGATIVE
Ketones, ur: NEGATIVE mg/dL
Leukocytes,Ua: NEGATIVE
Nitrite: NEGATIVE
Protein, ur: NEGATIVE mg/dL
Specific Gravity, Urine: 1.011 (ref 1.005–1.030)
pH: 5 (ref 5.0–8.0)

## 2019-05-18 LAB — BASIC METABOLIC PANEL
Anion gap: 7 (ref 5–15)
BUN: 7 mg/dL (ref 6–20)
CO2: 19 mmol/L — ABNORMAL LOW (ref 22–32)
Calcium: 8.7 mg/dL — ABNORMAL LOW (ref 8.9–10.3)
Chloride: 111 mmol/L (ref 98–111)
Creatinine, Ser: 0.53 mg/dL (ref 0.44–1.00)
GFR calc Af Amer: >60 mL/min (ref >60)
GFR calc non Af Amer: >60 mL/min (ref >60)
Glucose, Bld: 201 mg/dL — ABNORMAL HIGH (ref 70–99)
Potassium: 3.5 mmol/L (ref 3.5–5.1)
Sodium: 137 mmol/L (ref 135–145)

## 2019-05-18 LAB — CBC
HCT: 29.6 % — ABNORMAL LOW (ref 36.0–46.0)
Hemoglobin: 8.2 g/dL — ABNORMAL LOW (ref 12.0–15.0)
MCH: 19.6 pg — ABNORMAL LOW (ref 26.0–34.0)
MCHC: 27.7 g/dL — ABNORMAL LOW (ref 30.0–36.0)
MCV: 70.6 fL — ABNORMAL LOW (ref 80.0–100.0)
Platelets: 271 10*3/uL (ref 150–400)
RBC: 4.19 MIL/uL (ref 3.87–5.11)
RDW: 16.3 % — ABNORMAL HIGH (ref 11.5–15.5)
WBC: 5 10*3/uL (ref 4.0–10.5)
nRBC: 0 % (ref 0.0–0.2)

## 2019-05-18 LAB — LIPID PANEL
Cholesterol: 175 mg/dL (ref 0–200)
HDL: 33 mg/dL — ABNORMAL LOW (ref >40)
LDL Cholesterol: 123 mg/dL — ABNORMAL HIGH (ref 0–99)
Total CHOL/HDL Ratio: 5.3 RATIO
Triglycerides: 96 mg/dL (ref <150)
VLDL: 19 mg/dL (ref 0–40)

## 2019-05-18 LAB — HEMOGLOBIN A1C
Hgb A1c MFr Bld: 13.9 % — ABNORMAL HIGH (ref 4.8–5.6)
Mean Plasma Glucose: 352.23 mg/dL

## 2019-05-18 LAB — GLUCOSE, CAPILLARY
Glucose-Capillary: 381 mg/dL — ABNORMAL HIGH (ref 70–99)
Glucose-Capillary: 317 mg/dL — ABNORMAL HIGH (ref 70–99)

## 2019-05-18 LAB — SARS CORONAVIRUS 2 (TAT 6-24 HRS): SARS Coronavirus 2: NEGATIVE

## 2019-05-18 LAB — POCT PREGNANCY, URINE: Preg Test, Ur: NEGATIVE

## 2019-05-18 LAB — TSH: TSH: 1.671 u[IU]/mL (ref 0.350–4.500)

## 2019-05-18 MED ORDER — INSULIN ASPART 100 UNIT/ML ~~LOC~~ SOLN
2.0000 [IU] | Freq: Three times a day (TID) | SUBCUTANEOUS | Status: DC
  Administered 2019-05-18 – 2019-05-19 (×3): 2 [IU] via SUBCUTANEOUS
  Filled 2019-05-18 (×3): qty 1

## 2019-05-18 MED ORDER — INSULIN DETEMIR 100 UNIT/ML ~~LOC~~ SOLN
5.0000 [IU] | Freq: Every day | SUBCUTANEOUS | Status: DC
  Administered 2019-05-18: 5 [IU] via SUBCUTANEOUS
  Filled 2019-05-18 (×2): qty 0.05

## 2019-05-18 MED ORDER — INSULIN ASPART 100 UNIT/ML ~~LOC~~ SOLN
5.0000 [IU] | Freq: Once | SUBCUTANEOUS | Status: AC
  Administered 2019-05-18: 01:00:00 5 [IU] via SUBCUTANEOUS
  Filled 2019-05-18: qty 1

## 2019-05-18 MED ORDER — GADOBUTROL 1 MMOL/ML IV SOLN
8.0000 mL | Freq: Once | INTRAVENOUS | Status: AC | PRN
  Administered 2019-05-18: 8 mL via INTRAVENOUS

## 2019-05-18 MED ORDER — INSULIN ASPART 100 UNIT/ML ~~LOC~~ SOLN
0.0000 [IU] | Freq: Three times a day (TID) | SUBCUTANEOUS | Status: DC
  Administered 2019-05-18: 9 [IU] via SUBCUTANEOUS
  Administered 2019-05-19 (×2): 5 [IU] via SUBCUTANEOUS
  Filled 2019-05-18 (×3): qty 1

## 2019-05-18 MED ORDER — ASPIRIN EC 81 MG PO TBEC
81.0000 mg | DELAYED_RELEASE_TABLET | Freq: Every day | ORAL | Status: DC
  Administered 2019-05-18 – 2019-05-19 (×2): 81 mg via ORAL
  Filled 2019-05-18 (×2): qty 1

## 2019-05-18 MED ORDER — INSULIN DETEMIR 100 UNIT/ML ~~LOC~~ SOLN
5.0000 [IU] | Freq: Every day | SUBCUTANEOUS | Status: DC
  Filled 2019-05-18: qty 0.05

## 2019-05-18 MED ORDER — CLOPIDOGREL BISULFATE 75 MG PO TABS
75.0000 mg | ORAL_TABLET | Freq: Every day | ORAL | Status: DC
  Administered 2019-05-18 – 2019-05-19 (×2): 75 mg via ORAL
  Filled 2019-05-18 (×2): qty 1

## 2019-05-18 MED ORDER — SERTRALINE HCL 50 MG PO TABS
50.0000 mg | ORAL_TABLET | Freq: Every day | ORAL | Status: DC
  Administered 2019-05-18: 21:00:00 50 mg via ORAL
  Filled 2019-05-18: qty 1

## 2019-05-18 MED ORDER — ENOXAPARIN SODIUM 40 MG/0.4ML ~~LOC~~ SOLN
40.0000 mg | SUBCUTANEOUS | Status: DC
  Administered 2019-05-18 – 2019-05-19 (×2): 40 mg via SUBCUTANEOUS
  Filled 2019-05-18 (×2): qty 0.4

## 2019-05-18 MED ORDER — INSULIN ASPART 100 UNIT/ML ~~LOC~~ SOLN
0.0000 [IU] | Freq: Every day | SUBCUTANEOUS | Status: DC
  Administered 2019-05-18: 21:00:00 4 [IU] via SUBCUTANEOUS
  Filled 2019-05-18: qty 1

## 2019-05-18 MED ORDER — HYDROCHLOROTHIAZIDE 25 MG PO TABS
25.0000 mg | ORAL_TABLET | Freq: Every day | ORAL | Status: DC
  Administered 2019-05-18 – 2019-05-19 (×2): 25 mg via ORAL
  Filled 2019-05-18 (×2): qty 1

## 2019-05-18 NOTE — Evaluation (Signed)
Physical Therapy Evaluation Patient Details Name: Colleen Farmer MRN: [id number] DOB: Nov 18, 1980 Today's Date: 05/18/2019   History of Present Illness  Patient is a 39 year old female admitted with falls, weakness on left side. MRI of the brain shows acute infarcts in the right caudate and right corona radiata. PMH includes DM, HTN, anemia.  Clinical Impression  Patient received in bed, agrees to PT assessment. Patient is independent with bed mobility and transfers. Reports she has been getting up to the bathroom independently. Ambulated 100 feet without ad, supervision. Decreased step length on left, gross decreased strength on left. 4-/5 compared to 5/5 on right. Decreased coordination on left UE and LE. Patient will continue to benefit from skilled PT while here to improve strength and coordination on left side to return to work and prior functional level.       Follow Up Recommendations Outpatient PT    Equipment Recommendations  None recommended by PT    Recommendations for Other Services       Precautions / Restrictions Precautions Precautions: Fall Restrictions Weight Bearing Restrictions: No      Mobility  Bed Mobility Overal bed mobility: Independent                Transfers Overall transfer level: Independent                  Ambulation/Gait Ambulation/Gait assistance: Supervision Gait Distance (Feet): 100 Feet Assistive device: None Gait Pattern/deviations: Decreased dorsiflexion - left;Decreased step length - left Gait velocity: decreased   General Gait Details: decreased foot clearance on left, tends to bump into left side  Stairs Stairs: Yes Stairs assistance: Supervision Stair Management: Two rails Number of Stairs: 4    Wheelchair Mobility    Modified Rankin (Stroke Patients Only)       Balance Overall balance assessment: Mild deficits observed, not formally tested                                            Pertinent Vitals/Pain Pain Assessment: No/denies pain    Home Living Family/patient expects to be discharged to:: Private residence Living Arrangements: Other relatives Available Help at Discharge: Family Type of Home: Apartment Home Access: Stairs to enter   Secretary/administrator of Steps: flight Home Layout: One level Home Equipment: None      Prior Function Level of Independence: Independent               Hand Dominance        Extremity/Trunk Assessment   Upper Extremity Assessment Upper Extremity Assessment: Defer to OT evaluation    Lower Extremity Assessment Lower Extremity Assessment: LLE deficits/detail LLE Deficits / Details: weakness noted throughout L LE LLE Coordination: decreased gross motor    Cervical / Trunk Assessment Cervical / Trunk Assessment: Normal  Communication   Communication: No difficulties  Cognition Arousal/Alertness: Awake/alert Behavior During Therapy: WFL for tasks assessed/performed Overall Cognitive Status: Within Functional Limits for tasks assessed                                        General Comments      Exercises     Assessment/Plan    PT Assessment Patient needs continued PT services  PT Problem List Decreased strength;Decreased coordination;Decreased safety awareness  PT Treatment Interventions Therapeutic activities;Patient/family education;Gait training;Therapeutic exercise;Neuromuscular re-education    PT Goals (Current goals can be found in the Care Plan section)  Acute Rehab PT Goals Patient Stated Goal: to return home PT Goal Formulation: With patient Time For Goal Achievement: 05/25/19 Potential to Achieve Goals: Good    Frequency 7X/week   Barriers to discharge        Co-evaluation               AM-PAC PT "6 Clicks" Mobility  Outcome Measure Help needed turning from your back to your side while in a flat bed without using bedrails?: None Help needed  moving from lying on your back to sitting on the side of a flat bed without using bedrails?: None Help needed moving to and from a bed to a chair (including a wheelchair)?: None Help needed standing up from a chair using your arms (e.g., wheelchair or bedside chair)?: None Help needed to walk in hospital room?: A Little Help needed climbing 3-5 steps with a railing? : A Little 6 Click Score: 22    End of Session Equipment Utilized During Treatment: Gait belt Activity Tolerance: Patient tolerated treatment well Patient left: in bed;with call bell/phone within reach Nurse Communication: Mobility status PT Visit Diagnosis: Other abnormalities of gait and mobility (R26.89);Muscle weakness (generalized) (M62.81);Repeated falls (R29.6);Hemiplegia and hemiparesis Hemiplegia - Right/Left: Left Hemiplegia - dominant/non-dominant: Non-dominant Hemiplegia - caused by: Cerebral infarction    Time: 1610-9604 PT Time Calculation (min) (ACUTE ONLY): 17 min   Charges:   PT Evaluation $PT Eval Moderate Complexity: 1 Mod PT Treatments $Gait Training: 8-22 mins        Nadeen Shipman, PT, GCS 05/18/19,1:39 PM

## 2019-05-18 NOTE — Progress Notes (Signed)
1        [place] [facility]   PATIENT NAME: Colleen Farmer    MR#:  [id number]  DATE OF BIRTH:  08-31-1980  SUBJECTIVE:  CHIEF COMPLAINT:   Chief Complaint  Patient presents with  . Fall  Left-sided weakness REVIEW OF SYSTEMS:  Review of Systems  Constitutional: Negative for diaphoresis, fever, malaise/fatigue and weight loss.  HENT: Negative for ear discharge, ear pain, hearing loss, nosebleeds, sore throat and tinnitus.   Eyes: Negative for blurred vision and pain.  Respiratory: Negative for cough, hemoptysis, shortness of breath and wheezing.   Cardiovascular: Negative for chest pain, palpitations, orthopnea and leg swelling.  Gastrointestinal: Negative for abdominal pain, blood in stool, constipation, diarrhea, heartburn, nausea and vomiting.  Genitourinary: Negative for dysuria, frequency and urgency.  Musculoskeletal: Positive for falls. Negative for back pain and myalgias.  Skin: Negative for itching and rash.  Neurological: Positive for speech change and focal weakness. Negative for dizziness, tingling, tremors, seizures, weakness and headaches.  Psychiatric/Behavioral: Negative for depression. The patient is not nervous/anxious.    DRUG ALLERGIES:  No Known Allergies VITALS:  Blood pressure (!) 184/99, pulse 69, temperature 98.9 F (37.2 C), resp. rate 16, height 5\' 3"  (1.6 m), weight 79.8 kg, SpO2 100 %. PHYSICAL EXAMINATION:  Physical Exam HENT:     Head: Normocephalic and atraumatic.  Eyes:     Conjunctiva/sclera: Conjunctivae normal.     Pupils: Pupils are equal, round, and reactive to light.  Neck:     Thyroid: No thyromegaly.     Trachea: No tracheal deviation.  Cardiovascular:     Rate and Rhythm: Normal rate and regular rhythm.     Heart sounds: Normal heart sounds.  Pulmonary:     Effort: Pulmonary effort is normal. No respiratory distress.     Breath sounds: Normal breath sounds. No wheezing.  Chest:     Chest wall: No tenderness.    Abdominal:     General: Bowel sounds are normal. There is no distension.     Palpations: Abdomen is soft.     Tenderness: There is no abdominal tenderness.  Musculoskeletal:        General: Normal range of motion.     Cervical back: Normal range of motion and neck supple.  Skin:    General: Skin is warm and dry.     Findings: No rash.  Neurological:     Mental Status: She is alert and oriented to person, place, and time.     Cranial Nerves: No cranial nerve deficit.     Motor: Weakness present.     Comments: Left lower extremity weakness 4/5    LABORATORY PANEL:  Female CBC Recent Labs  Lab 05/18/19 0425  WBC 5.0  HGB 8.2*  HCT 29.6*  PLT 271   ------------------------------------------------------------------------------------------------------------------ Chemistries  Recent Labs  Lab 05/18/19 0425  NA 137  K 3.5  CL 111  CO2 19*  GLUCOSE 201*  BUN 7  CREATININE 0.53  CALCIUM 8.7*   RADIOLOGY:  CT Head Wo Contrast  Result Date: 05/17/2019 CLINICAL DATA:  Ataxia, stroke suspected. Patient reports multiple falls in the past week. EXAM: CT HEAD WITHOUT CONTRAST TECHNIQUE: Contiguous axial images were obtained from the base of the skull through the vertex without intravenous contrast. COMPARISON:  Head CT 09/17/2010 FINDINGS: Brain: No acute intracranial hemorrhage. Mild generalized cerebral and cerebellar atrophy that is advanced for age. Encephalomalacia in the right caudate and basal ganglia. Additional periventricular and deep white  matter hypodensity most consistent with chronic small vessel ischemia. Small central pontine hypodensity appears chronic. No midline shift or mass effect. Vascular: Atherosclerosis of the left carotid siphon, advanced for age. No hyperdense vessel. Skull: No fracture or focal lesion. Sinuses/Orbits: Paranasal sinuses and mastoid air cells are clear. The visualized orbits are unremarkable. Other: Dermal lesion with calcification at the scalp  vertex, series 4, image 27 this should be amenable to direct visualization. IMPRESSION: 1. No acute intracranial abnormality. 2. Generalized cerebral and cerebellar atrophy that is advanced for age. Periventricular and deep white matter hypodensity is nonspecific, typically chronic small vessel ischemia but unusual for age. Demyelinating disorders such as multiple sclerosis could have a similar, consider further evaluation with brain MRI. Remote lacunar infarcts in the right caudate and basal ganglia. 3. Atherosclerosis of the left carotid siphon, advanced for age. 4. Dermal lesion at the scalp vertex, this should be amenable to direct visualization. Electronically Signed   By: Keith Rake M.D.   On: 05/17/2019 22:38   MR BRAIN W WO CONTRAST  Result Date: 05/18/2019 CLINICAL DATA:  Weakness on the left side causing multiple falls. EXAM: MRI HEAD WITHOUT AND WITH CONTRAST TECHNIQUE: Multiplanar, multiecho pulse sequences of the brain and surrounding structures were obtained without and with intravenous contrast. CONTRAST:  66mL GADAVIST GADOBUTROL 1 MMOL/ML IV SOLN COMPARISON:  Head CT from yesterday FINDINGS: Brain: 2 small areas of acute infarction at the right caudate body and corona radiata. Extensive chronic small vessel ischemic injury with numerous lacunar infarcts in the brainstem, deep gray nuclei, and deep white matter tracts. There is superimposed gliosis in the white matter and deep cerebellum. Low brain volume for age, especially in the areas of chronic ischemia. Small remote left lateral cerebellar infarct. Numerous chronic hemorrhagic foci mainly in the deep brain. No masslike finding, hydrocephalus, collection, or abnormal enhancement. Vascular: Normal flow voids and vascular enhancements. Skull and upper cervical spine: Low marrow signal on T1 weighted imaging correlating with history of anemia. Sinuses/Orbits: Negative Other: Polypoid scalp lesion at the vertex which should be apparent on  physical exam. IMPRESSION: 1. Acute small vessel infarct at the right caudate body and corona radiata. 2. Severe chronic small vessel ischemia. A monogenetic cause is considered given the patient's young age but based on the history and pattern findings may be purely related to uncontrolled hypertension. Electronically Signed   By: Monte Fantasia M.D.   On: 05/18/2019 07:42   ASSESSMENT AND PLAN:  39 y.o. female with a history of tobacco abuse, HTN, DM, medication noncompliance admitted for difficulty walking, slurred speech and left sided weakness.    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 Remote lacunar infarcts also present.  -Pending echo, carotid Dopplers.  PT/OT/speech evaluation -echo ordered  -Neurology consult -appreciate Dr. Doy Mince input -> Dual antiplatelet therapy with ASA 81mg  and Plavix 75mg  for three weeks with change to ASA 81mg  daily alone as monotherapy after that time. , LDL 123  HTN  -Blood pressure remains elevated, continue hydrochlorothiazide 25 mg p.o. daily and would let it autoregulate in acute severe phase for now.  Will need tighter control at discharge  Diabetes  A1c 13.9. Glucose 346 at admission. -Start sliding scale insulin, Levemir 5 units subcu nightly and 2 units of NovoLog with every meal  Anemia  Chronic, microcytic. Known history of iron deficiency anemia related to  heavy menstrual periods.  -monitor HgB  -would benefit from Fe supplementation at d/c   Depression  -Restarting Zoloft     DVT prophylaxis: Lovenox Family Communication: NO "discussed with patient" Disposition Plan:  Came from home Possible discharge either to home with home health versus SNF depending on PT/OT eval Barriers to DC -PT/OT eval, stroke work-up and clinical improvement  All the records are reviewed and case discussed with  Care Management/Social Worker. Management plans discussed with the patient, nursing and they are in agreement.  CODE STATUS: Full Code  TOTAL TIME TAKING CARE OF THIS PATIENT: 35 minutes.   More than 50% of the time was spent in counseling/coordination of care: YES  POSSIBLE D/C IN 1-2 DAYS, DEPENDING ON CLINICAL CONDITION.   Delfino Lovett M.D on 05/18/2019 at 1:20 PM  [facility]   CC: Primary care physician; Patient, No Pcp Per  Note: This dictation was prepared with Dragon dictation along with smaller phrase technology. Any transcriptional errors that result from this process are unintentional.

## 2019-05-18 NOTE — ED Notes (Addendum)
ED TO INPATIENT HANDOFF REPORT  ED Nurse Name and Phone #:   Elijah Birk RN   [phone]  S Name/Age/Gender Colleen Farmer 39 y.o. female Room/Bed: ED26A/ED26A  Code Status   Code Status: Not on file  Home/SNF/Other Home Patient oriented to: self, place, time and situation Is this baseline? Yes   Triage Complete: Triage complete  Chief Complaint Left-sided weakness [R53.1]  Triage Note Patient reports she has had multiple falls in the last week and she is unsure what is causing her falls. A&O x4 during triage. PAtient is diabetic but has not taken medicine in two years due to cost and no insurance    Allergies No Known Allergies  Level of Care/Admitting Diagnosis ED Disposition    ED Disposition Condition Comment   Admit  Hospital Area: [facility] [[id number]]  Level of Care: Med-Surg [16]  Covid Evaluation: Asymptomatic Screening Protocol (No Symptoms)  Diagnosis: Left-sided weakness [308657]  Admitting Physician: Arvilla Market [[id number]]  Attending Physician: Arvilla Market [[id number]]       B Medical/Surgery History Past Medical History:  Diagnosis Date  . Diabetes mellitus without complication (HCC)   . Hypertension   . Iron deficiency anemia   . Pre-diabetes    History reviewed. No pertinent surgical history.   A IV Location/Drains/Wounds Patient Lines/Drains/Airways Status   Active Line/Drains/Airways    Name:   Placement date:   Placement time:   Site:   Days:   Peripheral IV 05/17/19 Right Antecubital   05/17/19    1848    Antecubital   1          Intake/Output Last 24 hours No intake or output data in the 24 hours ending 05/18/19 0243  Labs/Imaging Results for orders placed or performed during the hospital encounter of 05/17/19 (from the past 48 hour(s))  Urinalysis, Complete w Microscopic     Status: Abnormal   Collection Time: 05/17/19  6:33 PM  Result Value Ref Range   Color, Urine STRAW (A) YELLOW    APPearance CLEAR (A) CLEAR   Specific Gravity, Urine 1.011 1.005 - 1.030   pH 5.0 5.0 - 8.0   Glucose, UA >=500 (A) NEGATIVE mg/dL   Hgb urine dipstick NEGATIVE NEGATIVE   Bilirubin Urine NEGATIVE NEGATIVE   Ketones, ur NEGATIVE NEGATIVE mg/dL   Protein, ur NEGATIVE NEGATIVE mg/dL   Nitrite NEGATIVE NEGATIVE   Leukocytes,Ua NEGATIVE NEGATIVE   RBC / HPF 0-5 0 - 5 RBC/hpf   WBC, UA 0-5 0 - 5 WBC/hpf   Bacteria, UA RARE (A) NONE SEEN   Squamous Epithelial / LPF 0-5 0 - 5    Comment: Performed at [facility], [address] [place], [place] [address]  Glucose, capillary     Status: Abnormal   Collection Time: 05/17/19  6:40 PM  Result Value Ref Range   Glucose-Capillary 325 (H) 70 - 99 mg/dL   Comment 1 Notify RN    Comment 2 Document in Chart   Basic metabolic panel     Status: Abnormal   Collection Time: 05/17/19  6:48 PM  Result Value Ref Range   Sodium 132 (L) 135 - 145 mmol/L   Potassium 4.0 3.5 - 5.1 mmol/L   Chloride 99 98 - 111 mmol/L   CO2 20 (L) 22 - 32 mmol/L   Glucose, Bld 346 (H) 70 - 99 mg/dL   BUN 9 6 - 20 mg/dL   Creatinine, Ser 2.72 0.44 - 1.00 mg/dL  Calcium 9.0 8.9 - 10.3 mg/dL   GFR calc non Af Amer >60 >60 mL/min   GFR calc Af Amer >60 >60 mL/min   Anion gap 13 5 - 15    Comment: Performed at [facility], [address] [place], [place] [address]  CBC     Status: Abnormal   Collection Time: 05/17/19  6:48 PM  Result Value Ref Range   WBC 5.6 4.0 - 10.5 K/uL   RBC 4.53 3.87 - 5.11 MIL/uL   Hemoglobin 9.1 (L) 12.0 - 15.0 g/dL   HCT 31.6 (L) 36.0 - 46.0 %   MCV 69.8 (L) 80.0 - 100.0 fL   MCH 20.1 (L) 26.0 - 34.0 pg   MCHC 28.8 (L) 30.0 - 36.0 g/dL   RDW 16.2 (H) 11.5 - 15.5 %   Platelets 318 150 - 400 K/uL   nRBC 0.0 0.0 - 0.2 %    Comment: Performed at [facility], [address] [place], [place] [address]  Blood gas, venous     Status: Abnormal   Collection Time: 05/17/19  6:55 PM  Result Value Ref Range   pH,  Ven 7.41 7.250 - 7.430   pCO2, Ven 37 (L) 44.0 - 60.0 mmHg   pO2, Ven 74.0 (H) 32.0 - 45.0 mmHg   Bicarbonate 23.5 20.0 - 28.0 mmol/L   Acid-base deficit 0.8 0.0 - 2.0 mmol/L   O2 Saturation 94.8 %   Patient temperature 37.0    Collection site VEIN    Sample type VENOUS     Comment: Performed at [facility], [address] [place], [place] [address]  Pregnancy, urine POC     Status: None   Collection Time: 05/18/19 12:21 AM  Result Value Ref Range   Preg Test, Ur NEGATIVE NEGATIVE    Comment:        THE SENSITIVITY OF THIS METHODOLOGY IS >24 mIU/mL    CT Head Wo Contrast  Result Date: 05/17/2019 CLINICAL DATA:  Ataxia, stroke suspected. Patient reports multiple falls in the past week. EXAM: CT HEAD WITHOUT CONTRAST TECHNIQUE: Contiguous axial images were obtained from the base of the skull through the vertex without intravenous contrast. COMPARISON:  Head CT 09/17/2010 FINDINGS: Brain: No acute intracranial hemorrhage. Mild generalized cerebral and cerebellar atrophy that is advanced for age. Encephalomalacia in the right caudate and basal ganglia. Additional periventricular and deep white matter hypodensity most consistent with chronic small vessel ischemia. Small central pontine hypodensity appears chronic. No midline shift or mass effect. Vascular: Atherosclerosis of the left carotid siphon, advanced for age. No hyperdense vessel. Skull: No fracture or focal lesion. Sinuses/Orbits: Paranasal sinuses and mastoid air cells are clear. The visualized orbits are unremarkable. Other: Dermal lesion with calcification at the scalp vertex, series 4, image 27 this should be amenable to direct visualization. IMPRESSION: 1. No acute intracranial abnormality. 2. Generalized cerebral and cerebellar atrophy that is advanced for age. Periventricular and deep white matter hypodensity is nonspecific, typically chronic small vessel ischemia but unusual for age. Demyelinating disorders such as  multiple sclerosis could have a similar, consider further evaluation with brain MRI. Remote lacunar infarcts in the right caudate and basal ganglia. 3. Atherosclerosis of the left carotid siphon, advanced for age. 4. Dermal lesion at the scalp vertex, this should be amenable to direct visualization. Electronically Signed   By: Keith Rake M.D.   On: 05/17/2019 22:38    Pending Labs [employer] (From admission, onward)    Start  Ordered   Signed and Held  HIV Antibody (routine testing w rflx)  (HIV Antibody (Routine testing w reflex) panel)  Once,   R     Signed and Held   Signed and Held  TSH  Once,   R     Signed and Held   Signed and Held  Basic metabolic panel  Tomorrow morning,   R     Signed and Held   Signed and Held  CBC  Tomorrow morning,   R     Signed and Held   Signed and Held  Lipid panel  Once,   R     Signed and Held   Signed and Held  Hemoglobin A1c  Once,   R     Signed and Held          Vitals/Pain Today's Vitals   05/17/19 2337 05/18/19 0000 05/18/19 0030 05/18/19 0100  BP: (!) 188/90 (!) 157/99 (!) 186/87 (!) 161/104  Pulse: 88 79 72 76  Resp: (!) 24 17 (!) 24 (!) 21  Temp:      TempSrc:      SpO2: 99% 98% 100% 100%  Weight:      Height:      PainSc: 0-No pain       Isolation Precautions No active isolations  Medications Medications  sodium chloride 0.9 % bolus 1,000 mL (0 mLs Intravenous Stopped 05/17/19 2147)  0.9 %  sodium chloride infusion ( Intravenous New Bag/Given 05/17/19 2202)  aspirin chewable tablet 324 mg (324 mg Oral Given 05/17/19 2334)  labetalol (NORMODYNE) injection 10 mg (10 mg Intravenous Given 05/17/19 2335)  insulin aspart (novoLOG) injection 5 Units (5 Units Subcutaneous Given 05/18/19 0109)    Mobility walks High fall risk   Focused Assessments Cardiac Assessment Handoff:    No results found for: CKTOTAL, CKMB, CKMBINDEX, TROPONINI No results found for: DDIMER Does the Patient currently have chest pain? No       R Recommendations: See Admitting Provider Note  Report given to: Inetta Fermo RN on 1A  Additional Notes:

## 2019-05-18 NOTE — Evaluation (Signed)
Occupational Therapy Evaluation Patient Details Name: Colleen Farmer MRN: [id number] DOB: 08-Oct-1980 Today's Date: 05/18/2019    History of Present Illness Patient is a 39 year old female admitted with falls, weakness on left side. MRI of the brain shows acute infarcts in the right caudate and right corona radiata. PMH includes DM, HTN, anemia.   Clinical Impression   Pt is 39 year old female who presents with new onset of R caudate and R corona radiata infracts (see above for presenting problems).  Pt lives at home with her twin sister and has not had the resources to take care of her diabetes needs but has been set up now.   Pt was independent in ADLs and working as a CNA prior to changes in LUE (dominant) and LLE functional use.  She is able to move L UE and hand but has decreased coordination and increased flexor tone.  Pt was given foam utensil holders which improved grasp and control.  She is able to complete self care skills with min assist.  Pt was also given pink/red theraputty with exercise handout as welll as fine motor coordination exercises and demonstration with teach back about weight bearing exercises to decrease flexor tone. Rec continued OT while in hospital to continue to work on increasing independence in ADLs, balance and functional mobility training, coordination exercises and outpatient OT services after discharge.    Follow Up Recommendations  Outpatient OT    Equipment Recommendations       Recommendations for Other Services       Precautions / Restrictions Precautions Precautions: Fall Restrictions Weight Bearing Restrictions: No      Mobility Bed Mobility Overal bed mobility: Independent                Transfers Overall transfer level: Independent                    Balance Overall balance assessment: Mild deficits observed, not formally tested                                         ADL either performed or assessed  with clinical judgement   ADL Overall ADL's : Needs assistance/impaired                                       General ADL Comments: Pt is able to complete self care skills with min assist and uses R hand to help L hand for pulling on socks and pants due to decreased strength and increased tone in LUE hand and UE which is domiant.  She needs extra time to hold objects like fork and toothbrush in L hand and given red foam utensil holder to help.  Pt also given pink/red theraputty with written exercises reviewed and fine motor ex for at home.     Vision Baseline Vision/History: No visual deficits Patient Visual Report: No change from baseline       Perception     Praxis      Pertinent Vitals/Pain Pain Assessment: No/denies pain     Hand Dominance Left   Extremity/Trunk Assessment Upper Extremity Assessment Upper Extremity Assessment: LUE deficits/detail LUE Deficits / Details: Pt has decreased AROM in LUe which is dominant and able to slowly oppose thumb to index and middle fingers  but not to others.  Increased flexor tone and educated in how to weight bear and encourage extension and not flexion.  Intact sensation. LUE Sensation: WNL LUE Coordination: decreased gross motor;decreased fine motor   Lower Extremity Assessment Lower Extremity Assessment: Defer to PT evaluation LLE Deficits / Details: weakness noted throughout L LE LLE Coordination: decreased gross motor   Cervical / Trunk Assessment Cervical / Trunk Assessment: Normal   Communication Communication Communication: No difficulties   Cognition Arousal/Alertness: Awake/alert Behavior During Therapy: WFL for tasks assessed/performed Overall Cognitive Status: Within Functional Limits for tasks assessed                                     General Comments       Exercises     Shoulder Instructions      Home Living Family/patient expects to be discharged to:: Private  residence Living Arrangements: Other relatives Available Help at Discharge: Family Type of Home: Apartment Home Access: Stairs to enter Technical brewer of Steps: flight   Home Layout: One level     Bathroom Shower/Tub: Teacher, early years/pre: Standard Bathroom Accessibility: No   Home Equipment: None          Prior Functioning/Environment Level of Independence: Independent                 OT Problem List: Decreased strength;Decreased range of motion;Decreased activity tolerance;Impaired balance (sitting and/or standing);Decreased coordination;Impaired UE functional use;Impaired tone      OT Treatment/Interventions: Self-care/ADL training;Therapeutic activities;Therapeutic exercise;Balance training;Patient/family education    OT Goals(Current goals can be found in the care plan section) Acute Rehab OT Goals Patient Stated Goal: to regain my independence OT Goal Formulation: With patient Time For Goal Achievement: 06/01/19 Potential to Achieve Goals: Good ADL Goals Pt Will Perform Eating: Independently;with adaptive utensils;sitting Pt Will Perform Upper Body Dressing: Independently;sitting Pt Will Perform Lower Body Dressing: with set-up;with supervision;sit to/from stand Pt Will Transfer to Toilet: with set-up;with supervision;regular height toilet Pt/caregiver will Perform Home Exercise Program: Left upper extremity;Increased strength;With theraputty;Independently;With written HEP provided  OT Frequency: Min 2X/week   Barriers to D/C:            Co-evaluation              AM-PAC OT "6 Clicks" Daily Activity     Outcome Measure Help from another person eating meals?: A Little Help from another person taking care of personal grooming?: A Little Help from another person toileting, which includes using toliet, bedpan, or urinal?: A Little Help from another person bathing (including washing, rinsing, drying)?: A Little Help from another  person to put on and taking off regular upper body clothing?: A Little Help from another person to put on and taking off regular lower body clothing?: A Little 6 Click Score: 18   End of Session Equipment Utilized During Treatment: Gait belt  Activity Tolerance: Patient tolerated treatment well Patient left: in chair;with call bell/phone within reach;with chair alarm set  OT Visit Diagnosis: Unsteadiness on feet (R26.81);Muscle weakness (generalized) (M62.81);Hemiplegia and hemiparesis Hemiplegia - Right/Left: Left Hemiplegia - dominant/non-dominant: Dominant Hemiplegia - caused by: Cerebral infarction                Time: 1400-1440 OT Time Calculation (min): 40 min Charges:  OT General Charges $OT Visit: 1 Visit OT Evaluation $OT Eval Low Complexity: 1 Low OT Treatments $Self Care/Home Management :  8-22 mins $Neuromuscular Re-education: 8-22 mins  Susanne Borders, OTR/L, [place] ascom [phone] 05/18/19, 3:16 PM

## 2019-05-18 NOTE — Progress Notes (Signed)
SLP Cancellation Note  Patient Details Name: Colleen Farmer MRN: [id number] DOB: [birth year]   Cancelled treatment:       Reason Eval/Treat Not Completed: Patient at procedure or test/unavailable; Patient unavailable at this time. Will complete evaluation at a later date.   Everlean Patterson, M.S. CCC-SLP Speech-Language Pathologist   Everlean Patterson 05/18/2019, 1:59 PM

## 2019-05-18 NOTE — Consult Note (Signed)
Requesting Physician: Sherryll Burger    Chief Complaint: Left sided weakness  I have been asked by Dr. Sherryll Burger to see this patient in consultation for acute stroke.  HPI: Colleen Farmer is an 39 y.o. female with a history of tobacco abuse, HTN, DM, medicine noncompliance who reports going to bed at baseline on 2/17.  When she awakened on 2/18 she had difficulty walking.  Falls noted.  Speech was slurred and patient noticed left sided weakness.  Patient presented after being encouraged by her family.     Date last known well: 05/15/2019 Time last known well: Time: 22:00 tPA Given: No: Outside time window  Past Medical History:  Diagnosis Date  . Diabetes mellitus without complication (HCC)   . Hypertension   . Iron deficiency anemia   . Pre-diabetes     History reviewed. No pertinent surgical history.  Family History  Problem Relation Age of Onset  . Stroke Mother   . Healthy Father   . Hypertension Sister    Social History:  reports that she has been smoking cigarettes. She has never used smokeless tobacco. She reports current alcohol use. She reports that she does not use drugs.  Allergies: No Known Allergies  Medications:  I have reviewed the patient's current medications. Prior to Admission: (noncompliant) Medications Prior to Admission  Medication Sig Dispense Refill Last Dose  . ferrous gluconate (FERGON) 324 MG tablet Take 1 tablet (324 mg total) by mouth daily with breakfast. (Patient not taking: Reported on 05/17/2019) 30 tablet 3 Not Taking at Unknown time  . hydrochlorothiazide (HYDRODIURIL) 25 MG tablet Take 1 tablet (25 mg total) by mouth daily. (Patient not taking: Reported on 05/17/2019) 30 tablet 5 Not Taking at Unknown time  . lisinopril (PRINIVIL,ZESTRIL) 10 MG tablet Take 1 tablet (10 mg total) by mouth daily. (Patient not taking: Reported on 05/17/2019) 30 tablet 5 Not Taking at Unknown time  . sertraline (ZOLOFT) 50 MG tablet Take 1 tablet (50 mg total) by mouth daily.  (Patient not taking: Reported on 05/17/2019) 30 tablet 2 Not Taking at Unknown time   Scheduled: . aspirin EC  81 mg Oral Daily  . enoxaparin (LOVENOX) injection  40 mg Subcutaneous Q24H  . hydrochlorothiazide  25 mg Oral Daily    ROS: History obtained from the patient  General ROS: negative for - chills, fatigue, fever, night sweats, weight gain or weight loss Psychological ROS: negative for - behavioral disorder, hallucinations, memory difficulties, mood swings or suicidal ideation Ophthalmic ROS: negative for - blurry vision, double vision, eye pain or loss of vision ENT ROS: negative for - epistaxis, nasal discharge, oral lesions, sore throat, tinnitus or vertigo Allergy and Immunology ROS: negative for - hives or itchy/watery eyes Hematological and Lymphatic ROS: negative for - bleeding problems, bruising or swollen lymph nodes Endocrine ROS: negative for - galactorrhea, hair pattern changes, polydipsia/polyuria or temperature intolerance Respiratory ROS: negative for - cough, hemoptysis, shortness of breath or wheezing Cardiovascular ROS: negative for - chest pain, dyspnea on exertion, edema or irregular heartbeat Gastrointestinal ROS: negative for - abdominal pain, diarrhea, hematemesis, nausea/vomiting or stool incontinence Genito-Urinary ROS: negative for - dysuria, hematuria, incontinence or urinary frequency/urgency Musculoskeletal ROS: negative for - joint swelling or muscular weakness Neurological ROS: as noted in HPI Dermatological ROS: negative for rash and skin lesion changes  Physical Examination: Blood pressure (!) 184/99, pulse 69, temperature 98.9 F (37.2 C), resp. rate 16, height 5\' 3"  (1.6 m), weight 79.8 kg, SpO2 100 %.  HEENT-  Normocephalic, no lesions, without obvious abnormality.  Normal external eye and conjunctiva.  Normal TM's bilaterally.  Normal auditory canals and external ears. Normal external nose, mucus membranes and septum.  Normal  pharynx. Cardiovascular- S1, S2 normal, pulses palpable throughout   Lungs- chest clear, no wheezing, rales, normal symmetric air entry Abdomen- soft, non-tender; bowel sounds normal; no masses,  no organomegaly Extremities- no edema Lymph-no adenopathy palpable Musculoskeletal-no joint tenderness, deformity or swelling Skin-warm and dry, no hyperpigmentation, vitiligo, or suspicious lesions  Neurological Examination   Mental Status: Alert, oriented, thought content appropriate.  Speech fluent without evidence of aphasia.  Able to follow 3 step commands without difficulty. Cranial Nerves: II: Discs flat bilaterally; Visual fields grossly normal, pupils equal, round, reactive to light and accommodation III,IV, VI: ptosis not present, extra-ocular motions intact bilaterally V,VII: smile symmetric, facial light touch sensation normal bilaterally VIII: hearing normal bilaterally IX,X: gag reflex present XI: bilateral shoulder shrug XII: midline tongue extension Motor: Right : Upper extremity   5/5    Left:     Upper extremity   5-/5  Lower extremity   5/5     Lower extremity   4+/5 Tone and bulk:normal tone throughout; no atrophy noted Sensory: Pinprick and light touch intact throughout, bilaterally Deep Tendon Reflexes: Symmetric throughout Plantars: Right: mute   Left: mute Cerebellar: Normal finger-to-nose and normal heel-to-shin testing bilaterally Gait: not tested due to safety concerns   Laboratory Studies:  Basic Metabolic Panel: Recent Labs  Lab 05/17/19 1848 05/18/19 0425  NA 132* 137  K 4.0 3.5  CL 99 111  CO2 20* 19*  GLUCOSE 346* 201*  BUN 9 7  CREATININE 0.56 0.53  CALCIUM 9.0 8.7*    Liver Function Tests: No results for input(s): AST, ALT, ALKPHOS, BILITOT, PROT, ALBUMIN in the last 168 hours. No results for input(s): LIPASE, AMYLASE in the last 168 hours. No results for input(s): AMMONIA in the last 168 hours.  CBC: Recent Labs  Lab 05/17/19 1848  05/18/19 0425  WBC 5.6 5.0  HGB 9.1* 8.2*  HCT 31.6* 29.6*  MCV 69.8* 70.6*  PLT 318 271    Cardiac Enzymes: No results for input(s): CKTOTAL, CKMB, CKMBINDEX, TROPONINI in the last 168 hours.  BNP: Invalid input(s): POCBNP  CBG: Recent Labs  Lab 05/17/19 1840  GLUCAP 325*    Microbiology: Results for orders placed or performed in visit on 05/11/17  GC/Chlamydia Probe Amp([facility])     Status: None   Collection Time: 05/11/17 10:18 AM   Specimen: Blood   VA  Result Value Ref Range Status   Chlamydia trachomatis, NAA Negative Negative Final   Neisseria gonorrhoeae by PCR Negative Negative Final    Coagulation Studies: No results for input(s): LABPROT, INR in the last 72 hours.  Urinalysis:  Recent Labs  Lab 05/17/19 1833  COLORURINE STRAW*  LABSPEC 1.011  PHURINE 5.0  GLUCOSEU >=500*  HGBUR NEGATIVE  BILIRUBINUR NEGATIVE  KETONESUR NEGATIVE  PROTEINUR NEGATIVE  NITRITE NEGATIVE  LEUKOCYTESUR NEGATIVE    Lipid Panel:    Component Value Date/Time   CHOL 175 05/18/2019 0425   TRIG 96 05/18/2019 0425   HDL 33 (L) 05/18/2019 0425   CHOLHDL 5.3 05/18/2019 0425   VLDL 19 05/18/2019 0425   LDLCALC 123 (H) 05/18/2019 0425    HgbA1C:  Lab Results  Component Value Date   HGBA1C 13.9 (H) 05/18/2019    Urine Drug Screen:  No results found for: LABOPIA, COCAINSCRNUR, LABBENZ, AMPHETMU, THCU, LABBARB  Alcohol  Level: No results for input(s): ETH in the last 168 hours.  Other results: EKG: normal sinus rhythm at 77 bpm.  Imaging: CT Head Wo Contrast  Result Date: 05/17/2019 CLINICAL DATA:  Ataxia, stroke suspected. Patient reports multiple falls in the past week. EXAM: CT HEAD WITHOUT CONTRAST TECHNIQUE: Contiguous axial images were obtained from the base of the skull through the vertex without intravenous contrast. COMPARISON:  Head CT 09/17/2010 FINDINGS: Brain: No acute intracranial hemorrhage. Mild generalized cerebral and cerebellar atrophy that is  advanced for age. Encephalomalacia in the right caudate and basal ganglia. Additional periventricular and deep white matter hypodensity most consistent with chronic small vessel ischemia. Small central pontine hypodensity appears chronic. No midline shift or mass effect. Vascular: Atherosclerosis of the left carotid siphon, advanced for age. No hyperdense vessel. Skull: No fracture or focal lesion. Sinuses/Orbits: Paranasal sinuses and mastoid air cells are clear. The visualized orbits are unremarkable. Other: Dermal lesion with calcification at the scalp vertex, series 4, image 27 this should be amenable to direct visualization. IMPRESSION: 1. No acute intracranial abnormality. 2. Generalized cerebral and cerebellar atrophy that is advanced for age. Periventricular and deep white matter hypodensity is nonspecific, typically chronic small vessel ischemia but unusual for age. Demyelinating disorders such as multiple sclerosis could have a similar, consider further evaluation with brain MRI. Remote lacunar infarcts in the right caudate and basal ganglia. 3. Atherosclerosis of the left carotid siphon, advanced for age. 4. Dermal lesion at the scalp vertex, this should be amenable to direct visualization. Electronically Signed   By: Narda Rutherford M.D.   On: 05/17/2019 22:38   MR BRAIN W WO CONTRAST  Result Date: 05/18/2019 CLINICAL DATA:  Weakness on the left side causing multiple falls. EXAM: MRI HEAD WITHOUT AND WITH CONTRAST TECHNIQUE: Multiplanar, multiecho pulse sequences of the brain and surrounding structures were obtained without and with intravenous contrast. CONTRAST:  36mL GADAVIST GADOBUTROL 1 MMOL/ML IV SOLN COMPARISON:  Head CT from yesterday FINDINGS: Brain: 2 small areas of acute infarction at the right caudate body and corona radiata. Extensive chronic small vessel ischemic injury with numerous lacunar infarcts in the brainstem, deep gray nuclei, and deep white matter tracts. There is superimposed  gliosis in the white matter and deep cerebellum. Low brain volume for age, especially in the areas of chronic ischemia. Small remote left lateral cerebellar infarct. Numerous chronic hemorrhagic foci mainly in the deep brain. No masslike finding, hydrocephalus, collection, or abnormal enhancement. Vascular: Normal flow voids and vascular enhancements. Skull and upper cervical spine: Low marrow signal on T1 weighted imaging correlating with history of anemia. Sinuses/Orbits: Negative Other: Polypoid scalp lesion at the vertex which should be apparent on physical exam. IMPRESSION: 1. Acute small vessel infarct at the right caudate body and corona radiata. 2. Severe chronic small vessel ischemia. A monogenetic cause is considered given the patient's young age but based on the history and pattern findings may be purely related to uncontrolled hypertension. Electronically Signed   By: Marnee Spring M.D.   On: 05/18/2019 07:42    Assessment: 39 y.o. female with a history of tobacco abuse, HTN, DM, medicine noncompliance presenting with complaints of difficulty walking, slurred speech and left sided weakness.  Patient outside tPA window.  MRI of the brain personally reviewed and shows acute infarcts in the right caudate and right corona radiata.  Etiology likely small vessel disease.  Imaging also shows extensive small vessel ischemic changes.   A1c 13.9, LDL 123.  Further work up recommended.  Stroke Risk Factors - diabetes mellitus, hyperlipidemia, hypertension and smoking  Plan: 1. Blood sugar management with target A1c<7.0.  Compliance stressed 2. Statin for lipid management with target LDL<70. 3. PT consult, OT consult, Speech consult 4. Echocardiogram with bubble study 5. Carotid dopplers 6. Prophylactic therapy-Dual antiplatelet therapy with ASA 81mg  and Plavix 75mg  for three weeks with change to ASA 81mg  daily alone as monotherapy after that time. 7. NPO until RN stroke swallow screen 8. Telemetry  monitoring 9. Frequent neuro checks 10. Smoking cessation counseling 11. BP management with target BP<140/80   Alexis Goodell, MD Neurology [phone] 05/18/2019, 11:52 AM

## 2019-05-19 ENCOUNTER — Inpatient Hospital Stay (HOSPITAL_COMMUNITY)
Admit: 2019-05-19 | Discharge: 2019-05-19 | Disposition: A | Discharge status: Home / Self Care | Payer: Medicaid Other | Attending: Neurology | Admitting: Neurology

## 2019-05-19 DIAGNOSIS — I6389 Other cerebral infarction: Secondary | ICD-10-CM

## 2019-05-19 LAB — GLUCOSE, CAPILLARY
Glucose-Capillary: 264 mg/dL — ABNORMAL HIGH (ref 70–99)
Glucose-Capillary: 295 mg/dL — ABNORMAL HIGH (ref 70–99)

## 2019-05-19 MED ORDER — SERTRALINE HCL 50 MG PO TABS: 50.0000 mg | ORAL_TABLET | Freq: Every day | ORAL | 0 refills | Status: DC

## 2019-05-19 MED ORDER — HYDROCHLOROTHIAZIDE 25 MG PO TABS: 25.0000 mg | ORAL_TABLET | Freq: Every day | ORAL | 5 refills | Status: DC

## 2019-05-19 MED ORDER — LABETALOL HCL 5 MG/ML IV SOLN
10.0000 mg | INTRAVENOUS | Status: DC | PRN
  Administered 2019-05-19: 07:00:00 10 mg via INTRAVENOUS
  Filled 2019-05-19: qty 4

## 2019-05-19 MED ORDER — FERROUS GLUCONATE 324 (38 FE) MG PO TABS: 324.0000 mg | ORAL_TABLET | Freq: Every day | ORAL | 3 refills | Status: AC

## 2019-05-19 MED ORDER — ATORVASTATIN CALCIUM 40 MG PO TABS: 40.0000 mg | ORAL_TABLET | Freq: Every day | ORAL | 0 refills | Status: AC

## 2019-05-19 MED ORDER — CLOPIDOGREL BISULFATE 75 MG PO TABS: 75.0000 mg | ORAL_TABLET | Freq: Every day | ORAL | 0 refills | Status: DC

## 2019-05-19 MED ORDER — ATORVASTATIN CALCIUM 20 MG PO TABS: 40.0000 mg | ORAL_TABLET | Freq: Every day | ORAL | Status: DC

## 2019-05-19 MED ORDER — METFORMIN HCL 500 MG PO TABS: 500.0000 mg | ORAL_TABLET | Freq: Two times a day (BID) | ORAL | 0 refills | Status: DC

## 2019-05-19 MED ORDER — LISINOPRIL 10 MG PO TABS: 10.0000 mg | ORAL_TABLET | Freq: Every day | ORAL | 0 refills | Status: DC

## 2019-05-19 MED ORDER — ASPIRIN 81 MG PO TBEC: 81.0000 mg | DELAYED_RELEASE_TABLET | Freq: Every day | ORAL | 0 refills | Status: DC

## 2019-05-19 NOTE — Progress Notes (Signed)
Physical Therapy Treatment Patient Details Name: Colleen Farmer MRN: [id number] DOB: [birth year] Today's Date: 05/19/2019    History of Present Illness Patient is a 39 year old female admitted with falls, weakness on left side. MRI of the brain shows acute infarcts in the right caudate and right corona radiata. PMH includes DM, HTN, anemia.    PT Comments    Pt ready for session.  Denies injury from fall earlier this am.  Stated feet got tangled in blankets.  Pt in bed and initiated getting up.  Feet again tangled in blankets.  She tries to get up and does not remove blankets fully before getting up.  Education provided and she voiced understanding.  She is able to stand and walk x 3 laps today around unit and go up/down 4 steps with rails.  Overall improved activity tolerance but continues with irregular step length and decreased height.  Holds LUE with elbow 90 degrees flexed along her body with gait.  Pt asks when she will be back to "normal."  Education provided regarding CVA recovery and expectations.  Discussed and shown ways to work on LUE on her own to help improve function.  Encouraged mobility at home and discussed overall safety awareness.  Pt in recliner at end of session with chair alarm set.  Education to call staff for assist before getting up. Voiced understanding.    Follow Up Recommendations  Outpatient PT     Equipment Recommendations  None recommended by PT    Recommendations for Other Services       Precautions / Restrictions Precautions Precautions: Fall Restrictions Weight Bearing Restrictions: No    Mobility  Bed Mobility Overal bed mobility: Needs Assistance Bed Mobility: Supine to Sit     Supine to sit: Supervision     General bed mobility comments: cues to remove blankets fully before getting OOB  Transfers Overall transfer level: Independent                  Ambulation/Gait Ambulation/Gait assistance: Supervision Gait Distance (Feet):  450 Feet Assistive device: None Gait Pattern/deviations: Decreased step length - right;Decreased step length - left;Step-through pattern Gait velocity: decreased   General Gait Details: irregular step pattern and decreased foot clearance.  Generally usnteady with decreased arm swing but no LOB noted.   Stairs             Wheelchair Mobility    Modified Rankin (Stroke Patients Only)       Balance Overall balance assessment: Mild deficits observed, not formally tested                                          Cognition Arousal/Alertness: Awake/alert Behavior During Therapy: WFL for tasks assessed/performed Overall Cognitive Status: Within Functional Limits for tasks assessed                                        Exercises Other Exercises Other Exercises: session focused on gait and education regarding CVA recovery and expectations    General Comments        Pertinent Vitals/Pain Pain Assessment: No/denies pain    Home Living                      Prior Function  PT Goals (current goals can now be found in the care plan section) Progress towards PT goals: Progressing toward goals    Frequency    7X/week      PT Plan Current plan remains appropriate    Co-evaluation              AM-PAC PT "6 Clicks" Mobility   Outcome Measure  Help needed turning from your back to your side while in a flat bed without using bedrails?: None Help needed moving from lying on your back to sitting on the side of a flat bed without using bedrails?: None Help needed moving to and from a bed to a chair (including a wheelchair)?: None Help needed standing up from a chair using your arms (e.g., wheelchair or bedside chair)?: None Help needed to walk in hospital room?: A Little Help needed climbing 3-5 steps with a railing? : A Little 6 Click Score: 22    End of Session Equipment Utilized During Treatment: Gait  belt Activity Tolerance: Patient tolerated treatment well Patient left: in chair;with call bell/phone within reach;with chair alarm set Nurse Communication: Mobility status Hemiplegia - Right/Left: Left Hemiplegia - dominant/non-dominant: Non-dominant Hemiplegia - caused by: Cerebral infarction     Time: 0911-0929 PT Time Calculation (min) (ACUTE ONLY): 18 min  Charges:  $Gait Training: 8-22 mins                    Danielle Dess, PTA 05/19/19, 11:20 AM

## 2019-05-19 NOTE — Discharge Instructions (Signed)

## 2019-05-19 NOTE — Progress Notes (Signed)
*  PRELIMINARY RESULTS* Echocardiogram 2D Echocardiogram has been performed.  Colleen Farmer 05/19/2019, 12:35 PM

## 2019-05-19 NOTE — Progress Notes (Signed)
Pts BP 174/88, manual 162/90. MD Sherryll Burger notified. Per MD pt ok to d/c

## 2019-05-19 NOTE — Progress Notes (Signed)
DISCHARGE NOTE:  Pt given discharge instructions and work note. Pt verbalized understanding. Sister at bedside. All questioned answered. Pt wheeled to car by staff. Sister providing transportation.

## 2019-05-19 NOTE — Discharge Summary (Signed)
[place] at [facility]   PATIENT NAME: Colleen Farmer    MR#:  [id number]  DATE OF BIRTH:  11-26-1980  DATE OF ADMISSION:  05/17/2019   ADMITTING PHYSICIAN: Delfino Lovett, MD  DATE OF DISCHARGE: 05/19/2019  PRIMARY CARE PHYSICIAN: Tiana Loft, MD   ADMISSION DIAGNOSIS:  Left-sided weakness [R53.1] Cerebrovascular accident (CVA), unspecified mechanism (HCC) [I63.9] Stroke (cerebrum) (HCC) [I63.9] DISCHARGE DIAGNOSIS:  Active Problems:   Essential hypertension   Prediabetes   Iron deficiency anemia due to chronic blood loss   Left-sided weakness   Recurrent falls   Depression   Stroke (cerebrum) (HCC)  SECONDARY DIAGNOSIS:   Past Medical History:  Diagnosis Date  . Diabetes mellitus without complication (HCC)   . Hypertension   . Iron deficiency anemia   . Pre-diabetes    HOSPITAL COURSE:  39 y.o. female with a history of tobacco abuse, HTN, DM, medication noncompliance admitted for difficulty walking, slurred speech and left sided weakness.   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 Remote lacunar infarcts also present.  -Normal echo, carotid Dopplers not showing any significant stenosis.  PT/OT/speech evaluation recommends outpatient follow-up if need -Neurology recommends -> Dual antiplatelet therapy with ASA 81mg  and Plavix 75mg  for three weeks with change toASA 81mg  dailyalone as monotherapy after that time. -I have given prescription for aspirin, Plavix, statin  HTN  -Patient has not seen Dr. Since February 2019 and has not taken any medication. - I have given prescription for hydrochlorothiazide and lisinopril, also discussed with sister to make sure she follows up with primary care -she would like to follow-up at [facility] primary care as PCP  Diabetes  -Per sister patient was never diagnosed with diabetes.   She had gestational diabetes but since then she has not taken any medication or was diagnosed with diabetes.  She is also noncompliant in terms of following with her doctors A1c 13.9. Glucose 346 at admission. -We will start her on Metformin at discharge.  I have given prescription for that and will let her outpatient doctor decide on need for insulin.  She may need education and or endocrinology referral   Iron deficiency anemia  Chronic, microcytic. Known history of iron deficiency anemia related to heavy menstrual periods.  -Resume iron.  I have given prescription for same  Depression  -Restarting Zoloft, prescription given for same  DISCHARGE CONDITIONS:  stable CONSULTS OBTAINED:  Treatment Team:  , MD DRUG ALLERGIES:  No Known Allergies DISCHARGE MEDICATIONS:   Allergies as of 05/19/2019   No Known Allergies     Medication List    TAKE these medications   aspirin 81 MG EC tablet Take 1 tablet (81 mg total) by mouth daily. Start taking on: May 20, 2019   atorvastatin 40 MG tablet Commonly known as: LIPITOR Take 1 tablet (40 mg total) by mouth daily at 6 PM.   clopidogrel 75 MG tablet Commonly known as: PLAVIX Take 1 tablet (75 mg total) by mouth daily. Dual antiplatelet therapy with ASA 81mg  and Plavix 75mg  for three weeks with change to ASA 81mg  daily alone as monotherapy after that time. Start taking on: May 20, 2019   ferrous gluconate 324 MG tablet Commonly known as: FERGON Take 1 tablet (324 mg total) by mouth daily with breakfast.   hydrochlorothiazide 25 MG tablet Commonly known as: HYDRODIURIL Take 1 tablet (  25 mg total) by mouth daily.   lisinopril 10 MG tablet Commonly known as: ZESTRIL Take 1 tablet (10 mg total) by mouth daily.   metFORMIN 500 MG tablet Commonly known as: Glucophage Take 1 tablet (500 mg total) by mouth 2 (two) times daily with a meal.   sertraline 50 MG tablet Commonly known as: ZOLOFT Take 1  tablet (50 mg total) by mouth daily.      DISCHARGE INSTRUCTIONS:   DIET:  Cardiac diet DISCHARGE CONDITION:  Stable ACTIVITY:  Activity as tolerated OXYGEN:  Home Oxygen: No.  Oxygen Delivery: room air DISCHARGE LOCATION:  home   If you experience worsening of your admission symptoms, develop shortness of breath, life threatening emergency, suicidal or homicidal thoughts you must seek medical attention immediately by calling 911 or calling your MD immediately  if symptoms less severe.  You Must read complete instructions/literature along with all the possible adverse reactions/side effects for all the Medicines you take and that have been prescribed to you. Take any new Medicines after you have completely understood and accpet all the possible adverse reactions/side effects.   Please note  You were cared for by a hospitalist during your hospital stay. If you have any questions about your discharge medications or the care you received while you were in the hospital after you are discharged, you can call the unit and asked to speak with the hospitalist on call if the hospitalist that took care of you is not available. Once you are discharged, your primary care physician will handle any further medical issues. Please note that NO REFILLS for any discharge medications will be authorized once you are discharged, as it is imperative that you return to your primary care physician (or establish a relationship with a primary care physician if you do not have one) for your aftercare needs so that they can reassess your need for medications and monitor your lab values.    On the day of Discharge:  VITAL SIGNS:  Blood pressure (!) 160/100, pulse 67, temperature (!) 97.5 F (36.4 C), resp. rate 16, height 5\' 3"  (1.6 m), weight 79.8 kg, SpO2 100 %. PHYSICAL EXAMINATION:  GENERAL:  39 y.o.-year-old patient lying in the bed with no acute distress.  EYES: Pupils equal, round, reactive to light and  accommodation. No scleral icterus. Extraocular muscles intact.  HEENT: Head atraumatic, normocephalic. Oropharynx and nasopharynx clear.  NECK:  Supple, no jugular venous distention. No thyroid enlargement, no tenderness.  LUNGS: Normal breath sounds bilaterally, no wheezing, rales,rhonchi or crepitation. No use of accessory muscles of respiration.  CARDIOVASCULAR: S1, S2 normal. No murmurs, rubs, or gallops.  ABDOMEN: Soft, non-tender, non-distended. Bowel sounds present. No organomegaly or mass.  EXTREMITIES: No pedal edema, cyanosis, or clubbing.  NEUROLOGIC: Cranial nerves II through XII are intact. Muscle strength 5/5 in all extremities. Sensation intact. Gait not checked.  PSYCHIATRIC: The patient is alert and oriented x 3.  SKIN: No obvious rash, lesion, or ulcer.  DATA REVIEW:   CBC Recent Labs  Lab 05/18/19 0425  WBC 5.0  HGB 8.2*  HCT 29.6*  PLT 271    Chemistries  Recent Labs  Lab 05/18/19 0425  NA 137  K 3.5  CL 111  CO2 19*  GLUCOSE 201*  BUN 7  CREATININE 0.53  CALCIUM 8.7*     Outpatient follow-up Follow-up Information    05/20/19, MD. Schedule an appointment as soon as possible for a visit in 1 week(s).   Specialty: Pediatrics Contact  information: [address] [place] [address] [phone]        Vladimir Crofts, MD. Schedule an appointment as soon as possible for a visit in 2 week(s).   Specialty: Neurology Contact information: [facility] [facility] [place] [address] [phone]            Management plans discussed with the patient, family (discussed with sister over phone) and they are in agreement.  CODE STATUS: Full Code   TOTAL TIME TAKING CARE OF THIS PATIENT: 45 minutes.    Max Sane M.D on 05/19/2019 at 1:40 PM  [facility]   CC: Primary care physician; Gale Journey, MD   Note: This dictation was prepared with Dragon dictation along with smaller  phrase technology. Any transcriptional errors that result from this process are unintentional.

## 2019-05-19 NOTE — Progress Notes (Signed)
Post Fall Note:  Patient bed alarm sounded. This Rn and Matt when to patient room to find her on her knees beside the bed facing the bathroom. She reports getting her feet caught in the covers and going down on her knees. She was attempting to get up when we entered the room, however she was too weak and required assistance. She was heading to the bathroom. Patient assisted to standing and she was able to ambulate to the bathroom. Patient reminded that she needs to call for assistance out of the bed. Bed alarm setting changed from low sensitivity to moderate sensitivity. Patient denies injury. Vital signs stable.

## 2019-05-21 LAB — HIV ANTIBODY (ROUTINE TESTING W REFLEX): HIV Screen 4th Generation wRfx: NONREACTIVE — AB

## 2019-05-22 DIAGNOSIS — I63231 Cerebral infarction due to unspecified occlusion or stenosis of right carotid arteries: Secondary | ICD-10-CM | POA: Diagnosis not present

## 2019-05-22 DIAGNOSIS — R531 Weakness: Secondary | ICD-10-CM | POA: Diagnosis not present

## 2019-05-22 DIAGNOSIS — E1165 Type 2 diabetes mellitus with hyperglycemia: Secondary | ICD-10-CM | POA: Diagnosis not present

## 2019-05-22 DIAGNOSIS — G939 Disorder of brain, unspecified: Secondary | ICD-10-CM | POA: Diagnosis not present

## 2019-05-22 DIAGNOSIS — I63311 Cerebral infarction due to thrombosis of right middle cerebral artery: Secondary | ICD-10-CM | POA: Diagnosis not present

## 2019-05-22 DIAGNOSIS — R4182 Altered mental status, unspecified: Secondary | ICD-10-CM | POA: Diagnosis not present

## 2019-05-23 DIAGNOSIS — E1165 Type 2 diabetes mellitus with hyperglycemia: Secondary | ICD-10-CM | POA: Diagnosis not present

## 2019-05-23 DIAGNOSIS — R531 Weakness: Secondary | ICD-10-CM | POA: Diagnosis not present

## 2019-05-24 DIAGNOSIS — E1165 Type 2 diabetes mellitus with hyperglycemia: Secondary | ICD-10-CM | POA: Diagnosis not present

## 2019-05-24 MED ORDER — FERROUS SULFATE 75 (15 FE) MG/ML PO SOLN
ORAL | Status: DC
Start: 2019-05-25 — End: 2019-05-24

## 2019-05-24 MED ORDER — INSULIN LISPRO 100 UNIT/ML ~~LOC~~ SOLN
10.00 | SUBCUTANEOUS | Status: DC
Start: 2019-05-24 — End: 2019-05-24

## 2019-05-24 MED ORDER — DEXTROSE 50 % IV SOLN
12.50 | INTRAVENOUS | Status: DC
Start: ? — End: 2019-05-24

## 2019-05-24 MED ORDER — ENOXAPARIN SODIUM 40 MG/0.4ML ~~LOC~~ SOLN
40.00 | SUBCUTANEOUS | Status: DC
Start: 2019-05-24 — End: 2019-05-24

## 2019-05-24 MED ORDER — LISINOPRIL 5 MG PO TABS
5.00 | ORAL_TABLET | ORAL | Status: DC
Start: 2019-05-25 — End: 2019-05-24

## 2019-05-24 MED ORDER — ASPIRIN 81 MG PO CHEW
81.00 | CHEWABLE_TABLET | ORAL | Status: DC
Start: 2019-05-25 — End: 2019-05-24

## 2019-05-24 MED ORDER — HYDROCHLOROTHIAZIDE 25 MG PO TABS
25.00 | ORAL_TABLET | ORAL | Status: DC
Start: 2019-05-25 — End: 2019-05-24

## 2019-05-24 MED ORDER — CLOPIDOGREL BISULFATE 75 MG PO TABS
75.00 | ORAL_TABLET | ORAL | Status: DC
Start: 2019-05-25 — End: 2019-05-24

## 2019-05-24 MED ORDER — INSULIN GLARGINE 100 UNIT/ML ~~LOC~~ SOLN
22.00 | SUBCUTANEOUS | Status: DC
Start: 2019-05-24 — End: 2019-05-24

## 2019-05-24 MED ORDER — INSULIN LISPRO 100 UNIT/ML ~~LOC~~ SOLN
0.00 | SUBCUTANEOUS | Status: DC
Start: 2019-05-24 — End: 2019-05-24

## 2019-05-25 DIAGNOSIS — I6389 Other cerebral infarction: Secondary | ICD-10-CM | POA: Diagnosis not present

## 2019-05-25 DIAGNOSIS — D509 Iron deficiency anemia, unspecified: Secondary | ICD-10-CM | POA: Diagnosis not present

## 2019-05-25 DIAGNOSIS — F329 Major depressive disorder, single episode, unspecified: Secondary | ICD-10-CM | POA: Diagnosis not present

## 2019-05-25 DIAGNOSIS — E119 Type 2 diabetes mellitus without complications: Secondary | ICD-10-CM | POA: Diagnosis not present

## 2019-05-25 DIAGNOSIS — I10 Essential (primary) hypertension: Secondary | ICD-10-CM | POA: Diagnosis not present

## 2019-05-26 DIAGNOSIS — F329 Major depressive disorder, single episode, unspecified: Secondary | ICD-10-CM | POA: Diagnosis not present

## 2019-05-26 DIAGNOSIS — E1165 Type 2 diabetes mellitus with hyperglycemia: Secondary | ICD-10-CM | POA: Diagnosis not present

## 2019-05-26 DIAGNOSIS — D509 Iron deficiency anemia, unspecified: Secondary | ICD-10-CM | POA: Diagnosis not present

## 2019-05-26 DIAGNOSIS — I6389 Other cerebral infarction: Secondary | ICD-10-CM | POA: Diagnosis not present

## 2019-05-26 DIAGNOSIS — I10 Essential (primary) hypertension: Secondary | ICD-10-CM | POA: Diagnosis not present

## 2019-05-26 DIAGNOSIS — E119 Type 2 diabetes mellitus without complications: Secondary | ICD-10-CM | POA: Diagnosis not present

## 2019-05-27 DIAGNOSIS — E119 Type 2 diabetes mellitus without complications: Secondary | ICD-10-CM | POA: Diagnosis not present

## 2019-05-27 DIAGNOSIS — I6389 Other cerebral infarction: Secondary | ICD-10-CM | POA: Diagnosis not present

## 2019-05-27 DIAGNOSIS — D509 Iron deficiency anemia, unspecified: Secondary | ICD-10-CM | POA: Diagnosis not present

## 2019-05-27 DIAGNOSIS — F329 Major depressive disorder, single episode, unspecified: Secondary | ICD-10-CM | POA: Diagnosis not present

## 2019-05-27 DIAGNOSIS — I10 Essential (primary) hypertension: Secondary | ICD-10-CM | POA: Diagnosis not present

## 2019-05-29 DIAGNOSIS — E119 Type 2 diabetes mellitus without complications: Secondary | ICD-10-CM | POA: Diagnosis not present

## 2019-05-29 DIAGNOSIS — D509 Iron deficiency anemia, unspecified: Secondary | ICD-10-CM | POA: Diagnosis not present

## 2019-05-29 DIAGNOSIS — I6389 Other cerebral infarction: Secondary | ICD-10-CM | POA: Diagnosis not present

## 2019-05-29 DIAGNOSIS — I10 Essential (primary) hypertension: Secondary | ICD-10-CM | POA: Diagnosis not present

## 2019-05-29 DIAGNOSIS — F329 Major depressive disorder, single episode, unspecified: Secondary | ICD-10-CM | POA: Diagnosis not present

## 2019-05-30 DIAGNOSIS — I6389 Other cerebral infarction: Secondary | ICD-10-CM | POA: Diagnosis not present

## 2019-05-30 DIAGNOSIS — D509 Iron deficiency anemia, unspecified: Secondary | ICD-10-CM | POA: Diagnosis not present

## 2019-05-30 DIAGNOSIS — E119 Type 2 diabetes mellitus without complications: Secondary | ICD-10-CM | POA: Diagnosis not present

## 2019-05-30 DIAGNOSIS — F329 Major depressive disorder, single episode, unspecified: Secondary | ICD-10-CM | POA: Diagnosis not present

## 2019-05-30 DIAGNOSIS — I10 Essential (primary) hypertension: Secondary | ICD-10-CM | POA: Diagnosis not present

## 2019-05-30 DIAGNOSIS — Z794 Long term (current) use of insulin: Secondary | ICD-10-CM | POA: Diagnosis not present

## 2019-05-31 DIAGNOSIS — I6389 Other cerebral infarction: Secondary | ICD-10-CM | POA: Diagnosis not present

## 2019-05-31 DIAGNOSIS — E119 Type 2 diabetes mellitus without complications: Secondary | ICD-10-CM | POA: Diagnosis not present

## 2019-05-31 DIAGNOSIS — Z794 Long term (current) use of insulin: Secondary | ICD-10-CM | POA: Diagnosis not present

## 2019-05-31 DIAGNOSIS — I10 Essential (primary) hypertension: Secondary | ICD-10-CM | POA: Diagnosis not present

## 2019-05-31 DIAGNOSIS — F329 Major depressive disorder, single episode, unspecified: Secondary | ICD-10-CM | POA: Diagnosis not present

## 2019-05-31 DIAGNOSIS — D509 Iron deficiency anemia, unspecified: Secondary | ICD-10-CM | POA: Diagnosis not present

## 2019-06-01 DIAGNOSIS — I6389 Other cerebral infarction: Secondary | ICD-10-CM | POA: Diagnosis not present

## 2019-06-01 DIAGNOSIS — F329 Major depressive disorder, single episode, unspecified: Secondary | ICD-10-CM | POA: Diagnosis not present

## 2019-06-01 DIAGNOSIS — D509 Iron deficiency anemia, unspecified: Secondary | ICD-10-CM | POA: Diagnosis not present

## 2019-06-01 DIAGNOSIS — I10 Essential (primary) hypertension: Secondary | ICD-10-CM | POA: Diagnosis not present

## 2019-06-01 DIAGNOSIS — E119 Type 2 diabetes mellitus without complications: Secondary | ICD-10-CM | POA: Diagnosis not present

## 2019-06-02 DIAGNOSIS — F329 Major depressive disorder, single episode, unspecified: Secondary | ICD-10-CM | POA: Diagnosis not present

## 2019-06-02 DIAGNOSIS — D509 Iron deficiency anemia, unspecified: Secondary | ICD-10-CM | POA: Diagnosis not present

## 2019-06-02 DIAGNOSIS — I6389 Other cerebral infarction: Secondary | ICD-10-CM | POA: Diagnosis not present

## 2019-06-02 DIAGNOSIS — I10 Essential (primary) hypertension: Secondary | ICD-10-CM | POA: Diagnosis not present

## 2019-06-02 DIAGNOSIS — E119 Type 2 diabetes mellitus without complications: Secondary | ICD-10-CM | POA: Diagnosis not present

## 2019-06-03 DIAGNOSIS — E119 Type 2 diabetes mellitus without complications: Secondary | ICD-10-CM | POA: Diagnosis not present

## 2019-06-04 DIAGNOSIS — E119 Type 2 diabetes mellitus without complications: Secondary | ICD-10-CM | POA: Diagnosis not present

## 2019-06-05 DIAGNOSIS — E11649 Type 2 diabetes mellitus with hypoglycemia without coma: Secondary | ICD-10-CM | POA: Diagnosis not present

## 2019-06-06 DIAGNOSIS — F329 Major depressive disorder, single episode, unspecified: Secondary | ICD-10-CM | POA: Diagnosis not present

## 2019-06-06 DIAGNOSIS — E119 Type 2 diabetes mellitus without complications: Secondary | ICD-10-CM | POA: Diagnosis not present

## 2019-06-06 DIAGNOSIS — D509 Iron deficiency anemia, unspecified: Secondary | ICD-10-CM | POA: Diagnosis not present

## 2019-06-06 DIAGNOSIS — I6389 Other cerebral infarction: Secondary | ICD-10-CM | POA: Diagnosis not present

## 2019-06-06 DIAGNOSIS — I10 Essential (primary) hypertension: Secondary | ICD-10-CM | POA: Diagnosis not present

## 2019-06-07 DIAGNOSIS — E119 Type 2 diabetes mellitus without complications: Secondary | ICD-10-CM | POA: Diagnosis not present

## 2019-06-10 DIAGNOSIS — E119 Type 2 diabetes mellitus without complications: Secondary | ICD-10-CM | POA: Diagnosis not present

## 2019-06-10 DIAGNOSIS — I10 Essential (primary) hypertension: Secondary | ICD-10-CM | POA: Diagnosis not present

## 2019-06-10 DIAGNOSIS — F329 Major depressive disorder, single episode, unspecified: Secondary | ICD-10-CM | POA: Diagnosis not present

## 2019-06-10 DIAGNOSIS — I6389 Other cerebral infarction: Secondary | ICD-10-CM | POA: Diagnosis not present

## 2019-06-10 DIAGNOSIS — D509 Iron deficiency anemia, unspecified: Secondary | ICD-10-CM | POA: Diagnosis not present

## 2019-06-11 DIAGNOSIS — D509 Iron deficiency anemia, unspecified: Secondary | ICD-10-CM | POA: Diagnosis not present

## 2019-06-11 DIAGNOSIS — I10 Essential (primary) hypertension: Secondary | ICD-10-CM | POA: Diagnosis not present

## 2019-06-11 DIAGNOSIS — I6389 Other cerebral infarction: Secondary | ICD-10-CM | POA: Diagnosis not present

## 2019-06-11 DIAGNOSIS — E119 Type 2 diabetes mellitus without complications: Secondary | ICD-10-CM | POA: Diagnosis not present

## 2019-06-11 DIAGNOSIS — F3289 Other specified depressive episodes: Secondary | ICD-10-CM | POA: Diagnosis not present

## 2019-06-12 DIAGNOSIS — E119 Type 2 diabetes mellitus without complications: Secondary | ICD-10-CM | POA: Diagnosis not present

## 2019-06-12 DIAGNOSIS — I10 Essential (primary) hypertension: Secondary | ICD-10-CM | POA: Diagnosis not present

## 2019-06-12 DIAGNOSIS — F329 Major depressive disorder, single episode, unspecified: Secondary | ICD-10-CM | POA: Diagnosis not present

## 2019-06-12 DIAGNOSIS — D509 Iron deficiency anemia, unspecified: Secondary | ICD-10-CM | POA: Diagnosis not present

## 2019-06-12 DIAGNOSIS — I6389 Other cerebral infarction: Secondary | ICD-10-CM | POA: Diagnosis not present

## 2019-06-13 DIAGNOSIS — E119 Type 2 diabetes mellitus without complications: Secondary | ICD-10-CM | POA: Diagnosis not present

## 2019-06-13 DIAGNOSIS — F329 Major depressive disorder, single episode, unspecified: Secondary | ICD-10-CM | POA: Diagnosis not present

## 2019-06-13 DIAGNOSIS — D509 Iron deficiency anemia, unspecified: Secondary | ICD-10-CM | POA: Diagnosis not present

## 2019-06-13 DIAGNOSIS — W06XXXA Fall from bed, initial encounter: Secondary | ICD-10-CM | POA: Diagnosis not present

## 2019-06-13 DIAGNOSIS — I6389 Other cerebral infarction: Secondary | ICD-10-CM | POA: Diagnosis not present

## 2019-06-13 DIAGNOSIS — I10 Essential (primary) hypertension: Secondary | ICD-10-CM | POA: Diagnosis not present

## 2019-06-14 DIAGNOSIS — D509 Iron deficiency anemia, unspecified: Secondary | ICD-10-CM | POA: Diagnosis not present

## 2019-06-14 DIAGNOSIS — E119 Type 2 diabetes mellitus without complications: Secondary | ICD-10-CM | POA: Diagnosis not present

## 2019-06-14 DIAGNOSIS — F329 Major depressive disorder, single episode, unspecified: Secondary | ICD-10-CM | POA: Diagnosis not present

## 2019-06-14 DIAGNOSIS — I6389 Other cerebral infarction: Secondary | ICD-10-CM | POA: Diagnosis not present

## 2019-06-14 DIAGNOSIS — I10 Essential (primary) hypertension: Secondary | ICD-10-CM | POA: Diagnosis not present

## 2019-06-15 DIAGNOSIS — D509 Iron deficiency anemia, unspecified: Secondary | ICD-10-CM | POA: Diagnosis not present

## 2019-06-15 DIAGNOSIS — I10 Essential (primary) hypertension: Secondary | ICD-10-CM | POA: Diagnosis not present

## 2019-06-15 DIAGNOSIS — F329 Major depressive disorder, single episode, unspecified: Secondary | ICD-10-CM | POA: Diagnosis not present

## 2019-06-15 DIAGNOSIS — I6389 Other cerebral infarction: Secondary | ICD-10-CM | POA: Diagnosis not present

## 2019-06-15 DIAGNOSIS — E119 Type 2 diabetes mellitus without complications: Secondary | ICD-10-CM | POA: Diagnosis not present

## 2019-07-08 ENCOUNTER — Encounter: Payer: Self-pay | Admitting: Podiatry

## 2019-07-08 ENCOUNTER — Other Ambulatory Visit: Payer: Self-pay

## 2019-07-08 ENCOUNTER — Ambulatory Visit (INDEPENDENT_AMBULATORY_CARE_PROVIDER_SITE_OTHER): Payer: 59 | Admitting: Podiatry

## 2019-07-08 DIAGNOSIS — M79675 Pain in left toe(s): Secondary | ICD-10-CM

## 2019-07-08 DIAGNOSIS — M79674 Pain in right toe(s): Secondary | ICD-10-CM

## 2019-07-08 DIAGNOSIS — B351 Tinea unguium: Secondary | ICD-10-CM

## 2019-07-08 NOTE — Progress Notes (Signed)
.  This patient returns to my office for at risk foot care.  This patient requires this care by a professional since this patient will be at risk due to having CVA with left sided weakness.  This patient is unable to cut nails herself since the patient cannot reach her nails.These nails are painful walking and wearing shoes.  This patient presents for at risk foot care today. Patient presents to the office with female caregiver.  Patient wears brace left foot..  General Appearance  Alert, conversant and in no acute stress.  Vascular  Dorsalis pedis  pulses are weakly palpable  bilaterally. Posterior tibial pulses are absent   Capillary return is within normal limits  bilaterally. Temperature is within normal limits  bilaterally.  Neurologic  Senn-Weinstein monofilament wire test within normal limits  bilaterally. Muscle power within normal limitsright.  No muscle strength left leg/foot  Nails Thick disfigured discolored nails with subungual debris  from hallux to fifth toes bilaterally. No evidence of bacterial infection or drainage bilaterally.  Orthopedic   .  No crepitus or effusions noted.  No bony pathology or digital deformities noted.  Skin  normotropic skin with no porokeratosis noted bilaterally.  No signs of infections or ulcers noted.   Asymptomatic sub 2 porokeratosis right foot.  Onychomycosis  Pain in right toes  Pain in left toes  Consent was obtained for treatment procedures.   Mechanical debridement of nails 1-5  bilaterally performed with a nail nipper.  Filed with dremel without incident.    Return office visit     3 months                 Told patient to return for periodic foot care and evaluation due to potential at risk complications.   Mersedes Alber DPM  

## 2019-07-18 ENCOUNTER — Ambulatory Visit: Payer: Self-pay | Admitting: Podiatry
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
## 2019-10-10 ENCOUNTER — Encounter: Payer: Self-pay | Admitting: Podiatry

## 2019-10-10 ENCOUNTER — Ambulatory Visit (INDEPENDENT_AMBULATORY_CARE_PROVIDER_SITE_OTHER): Payer: Self-pay | Admitting: Podiatry

## 2019-10-10 ENCOUNTER — Other Ambulatory Visit: Payer: Self-pay

## 2019-10-10 DIAGNOSIS — B351 Tinea unguium: Secondary | ICD-10-CM

## 2019-10-10 DIAGNOSIS — M79674 Pain in right toe(s): Secondary | ICD-10-CM

## 2019-10-10 DIAGNOSIS — M79675 Pain in left toe(s): Secondary | ICD-10-CM

## 2019-10-10 NOTE — Progress Notes (Signed)
.  This patient returns to my office for at risk foot care.  This patient requires this care by a professional since this patient will be at risk due to having CVA with left sided weakness.  This patient is unable to cut nails herself since the patient cannot reach her nails.These nails are painful walking and wearing shoes.  This patient presents for at risk foot care today. Patient presents to the office with female caregiver.  Patient wears brace left foot..  General Appearance  Alert, conversant and in no acute stress.  Vascular  Dorsalis pedis  pulses are weakly palpable  bilaterally. Posterior tibial pulses are absent   Capillary return is within normal limits  bilaterally. Temperature is within normal limits  bilaterally.  Neurologic  Senn-Weinstein monofilament wire test within normal limits  bilaterally. Muscle power within normal limitsright.  No muscle strength left leg/foot  Nails Thick disfigured discolored nails with subungual debris  from hallux to fifth toes bilaterally. No evidence of bacterial infection or drainage bilaterally.  Orthopedic   .  No crepitus or effusions noted.  No bony pathology or digital deformities noted.  Skin  normotropic skin with no porokeratosis noted bilaterally.  No signs of infections or ulcers noted.   Asymptomatic sub 2 porokeratosis right foot.  Onychomycosis  Pain in right toes  Pain in left toes  Consent was obtained for treatment procedures.   Mechanical debridement of nails 1-5  bilaterally performed with a nail nipper.  Filed with dremel without incident.    Return office visit     3 months                 Told patient to return for periodic foot care and evaluation due to potential at risk complications.   Helane Gunther DPM

## 2019-10-13 DIAGNOSIS — I639 Cerebral infarction, unspecified: Secondary | ICD-10-CM | POA: Diagnosis not present

## 2019-11-01 DIAGNOSIS — F329 Major depressive disorder, single episode, unspecified: Secondary | ICD-10-CM | POA: Diagnosis not present

## 2019-11-01 DIAGNOSIS — B353 Tinea pedis: Secondary | ICD-10-CM | POA: Diagnosis not present

## 2019-11-01 DIAGNOSIS — I10 Essential (primary) hypertension: Secondary | ICD-10-CM | POA: Diagnosis not present

## 2019-11-01 DIAGNOSIS — E1169 Type 2 diabetes mellitus with other specified complication: Secondary | ICD-10-CM | POA: Diagnosis not present

## 2019-11-01 DIAGNOSIS — I639 Cerebral infarction, unspecified: Secondary | ICD-10-CM | POA: Diagnosis not present

## 2019-11-01 DIAGNOSIS — Z794 Long term (current) use of insulin: Secondary | ICD-10-CM | POA: Diagnosis not present

## 2019-11-13 DIAGNOSIS — I639 Cerebral infarction, unspecified: Secondary | ICD-10-CM | POA: Diagnosis not present

## 2019-12-14 DIAGNOSIS — I639 Cerebral infarction, unspecified: Secondary | ICD-10-CM | POA: Diagnosis not present

## 2019-12-27 DIAGNOSIS — H5213 Myopia, bilateral: Secondary | ICD-10-CM | POA: Diagnosis not present

## 2019-12-27 DIAGNOSIS — H3500 Unspecified background retinopathy: Secondary | ICD-10-CM | POA: Diagnosis not present

## 2020-01-10 DIAGNOSIS — M79676 Pain in unspecified toe(s): Secondary | ICD-10-CM

## 2020-01-13 DIAGNOSIS — I639 Cerebral infarction, unspecified: Secondary | ICD-10-CM | POA: Diagnosis not present

## 2020-01-16 ENCOUNTER — Ambulatory Visit: Payer: MEDICAID | Admitting: Podiatry

## 2020-01-23 ENCOUNTER — Ambulatory Visit (INDEPENDENT_AMBULATORY_CARE_PROVIDER_SITE_OTHER): Payer: Medicaid Other | Admitting: Podiatry

## 2020-01-23 ENCOUNTER — Encounter: Payer: Self-pay | Admitting: Podiatry

## 2020-01-23 ENCOUNTER — Other Ambulatory Visit: Payer: Self-pay

## 2020-01-23 DIAGNOSIS — I639 Cerebral infarction, unspecified: Secondary | ICD-10-CM | POA: Diagnosis not present

## 2020-01-23 DIAGNOSIS — M79675 Pain in left toe(s): Secondary | ICD-10-CM

## 2020-01-23 DIAGNOSIS — B351 Tinea unguium: Secondary | ICD-10-CM

## 2020-01-23 DIAGNOSIS — M79674 Pain in right toe(s): Secondary | ICD-10-CM

## 2020-01-23 NOTE — Progress Notes (Signed)
.  This patient returns to my office for at risk foot care.  This patient requires this care by a professional since this patient will be at risk due to having CVA with left sided weakness.  This patient is unable to cut nails herself since the patient cannot reach her nails.These nails are painful walking and wearing shoes.  This patient presents for at risk foot care today.   Patient wears brace left foot..  General Appearance  Alert, conversant and in no acute stress.  Vascular  Dorsalis pedis  pulses are weakly palpable  bilaterally. Posterior tibial pulses are absent   Capillary return is within normal limits  bilaterally. Temperature is within normal limits  bilaterally.  Neurologic  Senn-Weinstein monofilament wire test within normal limits  Right.  LOPS  Left foot absent.. Muscle power within normal limitsright.  No muscle strength left leg/foot  Nails Thick disfigured discolored nails with subungual debris  Hallux nails  bilaterally. No evidence of bacterial infection or drainage bilaterally.  Orthopedic   .  No crepitus or effusions noted.  No bony pathology or digital deformities noted.  Skin  normotropic skin with no porokeratosis noted bilaterally.  No signs of infections or ulcers noted.   Asymptomatic sub 2 porokeratosis right foot.  Onychomycosis  Pain in right toes  Pain in left toes  Consent was obtained for treatment procedures.   Mechanical debridement of hallux nails  bilaterally performed with a nail nipper.  Filed with dremel without incident.    Return office visit     3 months                 Told patient to return for periodic foot care and evaluation due to potential at risk complications.   Marranda Arakelian DPM  

## 2020-02-07 DIAGNOSIS — H5213 Myopia, bilateral: Secondary | ICD-10-CM | POA: Diagnosis not present

## 2020-02-13 DIAGNOSIS — I639 Cerebral infarction, unspecified: Secondary | ICD-10-CM | POA: Diagnosis not present

## 2020-04-10 DIAGNOSIS — R059 Cough, unspecified: Secondary | ICD-10-CM | POA: Diagnosis not present

## 2020-04-10 DIAGNOSIS — Z6825 Body mass index (BMI) 25.0-25.9, adult: Secondary | ICD-10-CM | POA: Diagnosis not present

## 2020-04-10 DIAGNOSIS — H6121 Impacted cerumen, right ear: Secondary | ICD-10-CM | POA: Diagnosis not present

## 2020-04-16 ENCOUNTER — Ambulatory Visit: Payer: Medicaid Other | Admitting: Podiatry

## 2020-07-30 DIAGNOSIS — Z6826 Body mass index (BMI) 26.0-26.9, adult: Secondary | ICD-10-CM | POA: Diagnosis not present

## 2020-07-30 DIAGNOSIS — F32A Depression, unspecified: Secondary | ICD-10-CM | POA: Diagnosis not present

## 2020-07-30 DIAGNOSIS — Z794 Long term (current) use of insulin: Secondary | ICD-10-CM | POA: Diagnosis not present

## 2020-07-30 DIAGNOSIS — E1169 Type 2 diabetes mellitus with other specified complication: Secondary | ICD-10-CM | POA: Diagnosis not present

## 2020-07-30 DIAGNOSIS — E119 Type 2 diabetes mellitus without complications: Secondary | ICD-10-CM | POA: Insufficient documentation

## 2020-07-30 DIAGNOSIS — H6122 Impacted cerumen, left ear: Secondary | ICD-10-CM | POA: Diagnosis not present

## 2020-07-30 DIAGNOSIS — Z1159 Encounter for screening for other viral diseases: Secondary | ICD-10-CM | POA: Diagnosis not present

## 2020-07-30 DIAGNOSIS — D649 Anemia, unspecified: Secondary | ICD-10-CM | POA: Diagnosis not present

## 2020-07-30 DIAGNOSIS — I10 Essential (primary) hypertension: Secondary | ICD-10-CM | POA: Diagnosis not present

## 2020-07-30 DIAGNOSIS — R531 Weakness: Secondary | ICD-10-CM | POA: Diagnosis not present

## 2020-08-06 ENCOUNTER — Ambulatory Visit (INDEPENDENT_AMBULATORY_CARE_PROVIDER_SITE_OTHER): Payer: Medicaid Other | Admitting: Podiatry

## 2020-08-06 ENCOUNTER — Encounter: Payer: Self-pay | Admitting: Podiatry

## 2020-08-06 ENCOUNTER — Other Ambulatory Visit: Payer: Self-pay

## 2020-08-06 DIAGNOSIS — M79675 Pain in left toe(s): Secondary | ICD-10-CM

## 2020-08-06 DIAGNOSIS — B351 Tinea unguium: Secondary | ICD-10-CM | POA: Diagnosis not present

## 2020-08-06 DIAGNOSIS — M79674 Pain in right toe(s): Secondary | ICD-10-CM | POA: Diagnosis not present

## 2020-08-06 DIAGNOSIS — I639 Cerebral infarction, unspecified: Secondary | ICD-10-CM

## 2020-08-06 NOTE — Progress Notes (Signed)
.  This patient returns to my office for at risk foot care.  This patient requires this care by a professional since this patient will be at risk due to having CVA with left sided weakness.  This patient is unable to cut nails herself since the patient cannot reach her nails.These nails are painful walking and wearing shoes.  This patient presents for at risk foot care today.   Patient wears brace left foot..  General Appearance  Alert, conversant and in no acute stress.  Vascular  Dorsalis pedis  pulses are weakly palpable  bilaterally. Posterior tibial pulses are absent   Capillary return is within normal limits  bilaterally. Temperature is within normal limits  bilaterally.  Neurologic  Senn-Weinstein monofilament wire test within normal limits  Right.  LOPS  Left foot absent.. Muscle power within normal limitsright.  No muscle strength left leg/foot  Nails Thick disfigured discolored nails with subungual debris  Hallux nails  bilaterally. No evidence of bacterial infection or drainage bilaterally.  Orthopedic   .  No crepitus or effusions noted.  No bony pathology or digital deformities noted.  Skin  normotropic skin with no porokeratosis noted bilaterally.  No signs of infections or ulcers noted.   Asymptomatic sub 2 porokeratosis right foot.  Onychomycosis  Pain in right toes  Pain in left toes  Consent was obtained for treatment procedures.   Mechanical debridement of hallux nails  bilaterally performed with a nail nipper.  Filed with dremel without incident.    Return office visit     3 months                 Told patient to return for periodic foot care and evaluation due to potential at risk complications.   Helane Gunther DPM

## 2020-11-12 ENCOUNTER — Ambulatory Visit: Payer: Medicaid Other | Admitting: Podiatry

## 2021-06-25 DIAGNOSIS — Z6824 Body mass index (BMI) 24.0-24.9, adult: Secondary | ICD-10-CM | POA: Diagnosis not present

## 2021-06-25 DIAGNOSIS — E1169 Type 2 diabetes mellitus with other specified complication: Secondary | ICD-10-CM | POA: Diagnosis not present

## 2021-06-25 DIAGNOSIS — I10 Essential (primary) hypertension: Secondary | ICD-10-CM | POA: Diagnosis not present

## 2021-06-25 DIAGNOSIS — D509 Iron deficiency anemia, unspecified: Secondary | ICD-10-CM | POA: Diagnosis not present

## 2021-06-25 DIAGNOSIS — Z794 Long term (current) use of insulin: Secondary | ICD-10-CM | POA: Diagnosis not present

## 2021-10-01 DIAGNOSIS — D649 Anemia, unspecified: Secondary | ICD-10-CM | POA: Diagnosis not present

## 2021-10-01 DIAGNOSIS — I10 Essential (primary) hypertension: Secondary | ICD-10-CM | POA: Diagnosis not present

## 2021-10-01 DIAGNOSIS — R87810 Cervical high risk human papillomavirus (HPV) DNA test positive: Secondary | ICD-10-CM | POA: Diagnosis not present

## 2021-10-01 DIAGNOSIS — Z6825 Body mass index (BMI) 25.0-25.9, adult: Secondary | ICD-10-CM | POA: Diagnosis not present

## 2021-10-01 DIAGNOSIS — Z794 Long term (current) use of insulin: Secondary | ICD-10-CM | POA: Diagnosis not present

## 2021-10-01 DIAGNOSIS — T753XXA Motion sickness, initial encounter: Secondary | ICD-10-CM | POA: Diagnosis not present

## 2021-10-01 DIAGNOSIS — E1169 Type 2 diabetes mellitus with other specified complication: Secondary | ICD-10-CM | POA: Diagnosis not present
